# Patient Record
Sex: Female | Born: 1990 | Hispanic: Yes | Marital: Single
Health system: Midwestern US, Community
[De-identification: ages and names within clinical notes are randomized; demographics above are authoritative.]

## PROBLEM LIST (undated history)

## (undated) ENCOUNTER — Inpatient Hospital Stay (HOSPITAL_COMMUNITY): Payer: Self-pay

## (undated) DIAGNOSIS — K810 Acute cholecystitis: Principal | ICD-10-CM

## (undated) DIAGNOSIS — Z3A41 41 weeks gestation of pregnancy: Secondary | ICD-10-CM

## (undated) DIAGNOSIS — D649 Anemia, unspecified: Secondary | ICD-10-CM

## (undated) DIAGNOSIS — O09299 Supervision of pregnancy with other poor reproductive or obstetric history, unspecified trimester: Secondary | ICD-10-CM

## (undated) DIAGNOSIS — N189 Chronic kidney disease, unspecified: Secondary | ICD-10-CM

## (undated) DIAGNOSIS — O480 Post-term pregnancy: Secondary | ICD-10-CM

## (undated) DIAGNOSIS — E669 Obesity, unspecified: Secondary | ICD-10-CM

## (undated) HISTORY — PX: NEPHRECTOMY: SHX65

## (undated) HISTORY — PX: ABDOMINAL HYSTERECTOMY: SHX81

## (undated) HISTORY — DX: Anemia, unspecified: D64.9

## (undated) HISTORY — PX: NO PAST SURGERIES: SHX2092

## (undated) HISTORY — DX: Obesity, unspecified: E66.9

## (undated) HISTORY — PX: CHOLECYSTECTOMY: SHX55

---

## 2014-12-20 ENCOUNTER — Inpatient Hospital Stay
Admit: 2014-12-20 | Discharge: 2014-12-20 | Disposition: A | Discharge status: Home / Self Care | Payer: Self-pay | Attending: Emergency Medicine

## 2014-12-20 ENCOUNTER — Emergency Department: Admit: 2014-12-20 | Payer: Self-pay

## 2014-12-20 ENCOUNTER — Observation Stay

## 2014-12-20 DIAGNOSIS — K8010 Calculus of gallbladder with chronic cholecystitis without obstruction: Secondary | ICD-10-CM

## 2014-12-20 LAB — CBC WITH AUTOMATED DIFF
ABS. BASOPHILS: 0 10*3/uL (ref 0.0–0.2)
ABS. EOSINOPHILS: 0 10*3/uL (ref 0.0–0.8)
ABS. IMM. GRANS.: 0 10*3/uL (ref 0.0–0.5)
ABS. LYMPHOCYTES: 1.6 10*3/uL (ref 0.5–4.6)
ABS. MONOCYTES: 0.3 10*3/uL (ref 0.1–1.3)
ABS. NEUTROPHILS: 10.6 10*3/uL — ABNORMAL HIGH (ref 1.7–8.2)
BASOPHILS: 0 % (ref 0.0–2.0)
EOSINOPHILS: 0 % — ABNORMAL LOW (ref 0.5–7.8)
HCT: 40 % (ref 35.8–46.3)
HGB: 13.1 g/dL (ref 11.7–15.4)
IMMATURE GRANULOCYTES: 0.2 % (ref 0.0–5.0)
LYMPHOCYTES: 13 % (ref 13–44)
MCH: 25.6 PG — ABNORMAL LOW (ref 26.1–32.9)
MCHC: 32.8 g/dL (ref 31.4–35.0)
MCV: 78.1 FL — ABNORMAL LOW (ref 79.6–97.8)
MONOCYTES: 2 % — ABNORMAL LOW (ref 4.0–12.0)
MPV: 9.8 FL — ABNORMAL LOW (ref 10.8–14.1)
NEUTROPHILS: 85 % — ABNORMAL HIGH (ref 43–78)
PLATELET: 235 10*3/uL (ref 150–450)
RBC: 5.12 M/uL (ref 4.05–5.25)
RDW: 13.4 % (ref 11.9–14.6)
WBC: 12.6 10*3/uL — ABNORMAL HIGH (ref 4.3–11.1)

## 2014-12-20 LAB — METABOLIC PANEL, COMPREHENSIVE
A-G Ratio: 1 — ABNORMAL LOW (ref 1.2–3.5)
ALT (SGPT): 28 U/L (ref 12–65)
AST (SGOT): 20 U/L (ref 15–37)
Albumin: 4.2 g/dL (ref 3.5–5.0)
Alk. phosphatase: 118 U/L (ref 50–136)
Anion gap: 8 mmol/L (ref 7–16)
BUN: 12 MG/DL (ref 6–23)
Bilirubin, total: 0.4 MG/DL (ref 0.2–1.1)
CO2: 31 mmol/L (ref 21–32)
Calcium: 9.1 MG/DL (ref 8.3–10.4)
Chloride: 101 mmol/L (ref 98–107)
Creatinine: 0.84 MG/DL (ref 0.6–1.0)
GFR est AA: >60 mL/min/{1.73_m2} (ref >60)
GFR est non-AA: >60 mL/min/{1.73_m2} (ref >60)
Globulin: 4.2 g/dL — ABNORMAL HIGH (ref 2.3–3.5)
Glucose: 122 mg/dL — ABNORMAL HIGH (ref 65–100)
Potassium: 3.7 mmol/L (ref 3.5–5.1)
Protein, total: 8.4 g/dL — ABNORMAL HIGH (ref 6.3–8.2)
Sodium: 140 mmol/L (ref 136–145)

## 2014-12-20 LAB — HCG URINE, QL. - POC: Pregnancy test,urine (POC): NEGATIVE

## 2014-12-20 LAB — LIPASE: Lipase: 181 U/L (ref 73–393)

## 2014-12-20 MED ORDER — DEXAMETHASONE SODIUM PHOSPHATE 4 MG/ML IJ SOLN
4 mg/mL | INTRAMUSCULAR | Status: DC | PRN
Start: 2014-12-20 — End: 2014-12-20
  Administered 2014-12-20: 20:00:00 via INTRAVENOUS

## 2014-12-20 MED ORDER — FAMOTIDINE 20 MG TAB
20 mg | Freq: Once | ORAL | Status: AC
Start: 2014-12-20 — End: 2014-12-20
  Administered 2014-12-20: 17:00:00 via ORAL

## 2014-12-20 MED ORDER — HYDROMORPHONE (PF) 2 MG/ML IJ SOLN
2 mg/mL | INTRAMUSCULAR | Status: DC | PRN
Start: 2014-12-20 — End: 2014-12-20
  Administered 2014-12-20: 22:00:00 via INTRAVENOUS

## 2014-12-20 MED ORDER — DICYCLOMINE 10 MG CAP
10 mg | ORAL | Status: AC
Start: 2014-12-20 — End: 2014-12-20
  Administered 2014-12-20: 13:00:00 via ORAL

## 2014-12-20 MED ORDER — ROCURONIUM 10 MG/ML IV
10 mg/mL | INTRAVENOUS | Status: AC
Start: 2014-12-20 — End: ?

## 2014-12-20 MED ORDER — FENTANYL CITRATE (PF) 50 MCG/ML IJ SOLN
50 mcg/mL | Freq: Once | INTRAMUSCULAR | Status: AC
Start: 2014-12-20 — End: 2014-12-20
  Administered 2014-12-20: 17:00:00 via INTRAVENOUS

## 2014-12-20 MED ORDER — FENTANYL CITRATE (PF) 50 MCG/ML IJ SOLN
50 mcg/mL | Freq: Once | INTRAMUSCULAR | Status: AC
Start: 2014-12-20 — End: 2014-12-20
  Administered 2014-12-20: 19:00:00 via INTRAVENOUS

## 2014-12-20 MED ORDER — BUPIVACAINE-EPINEPHRINE (PF) 0.5 %-1:200,000 IJ SOLN
0.5 %-1:200,000 | INTRAMUSCULAR | Status: AC
Start: 2014-12-20 — End: ?

## 2014-12-20 MED ORDER — BUPIVACAINE-EPINEPHRINE (PF) 0.5 %-1:200,000 IJ SOLN
0.5 %-1:200,000 | INTRAMUSCULAR | Status: DC | PRN
Start: 2014-12-20 — End: 2014-12-20
  Administered 2014-12-20: 20:00:00

## 2014-12-20 MED ORDER — SODIUM CHLORIDE 0.9% BOLUS IV
0.9 % | Freq: Once | INTRAVENOUS | Status: AC
Start: 2014-12-20 — End: 2014-12-20
  Administered 2014-12-20: 13:00:00 via INTRAVENOUS

## 2014-12-20 MED ORDER — FENTANYL CITRATE (PF) 50 MCG/ML IJ SOLN
50 mcg/mL | INTRAMUSCULAR | Status: AC
Start: 2014-12-20 — End: ?

## 2014-12-20 MED ORDER — HYDROCODONE-ACETAMINOPHEN 5 MG-325 MG TAB
5-325 mg | ORAL | Status: DC | PRN
Start: 2014-12-20 — End: 2014-12-20

## 2014-12-20 MED ORDER — ACETAMINOPHEN 325 MG TABLET
325 mg | ORAL | Status: DC | PRN
Start: 2014-12-20 — End: 2014-12-21

## 2014-12-20 MED ORDER — FENTANYL CITRATE (PF) 50 MCG/ML IJ SOLN
50 mcg/mL | INTRAMUSCULAR | Status: DC | PRN
Start: 2014-12-20 — End: 2014-12-20
  Administered 2014-12-20 (×3): via INTRAVENOUS

## 2014-12-20 MED ORDER — HYDROMORPHONE (PF) 1 MG/ML IJ SOLN
1 mg/mL | INTRAMUSCULAR | Status: DC | PRN
Start: 2014-12-20 — End: 2014-12-21

## 2014-12-20 MED ORDER — LACTATED RINGERS IV
INTRAVENOUS | Status: DC
Start: 2014-12-20 — End: 2014-12-20
  Administered 2014-12-20 (×3): via INTRAVENOUS

## 2014-12-20 MED ORDER — ROCURONIUM 10 MG/ML IV
10 mg/mL | INTRAVENOUS | Status: DC | PRN
Start: 2014-12-20 — End: 2014-12-20
  Administered 2014-12-20 (×2): via INTRAVENOUS

## 2014-12-20 MED ORDER — LIDOCAINE HCL 1 % (10 MG/ML) IJ SOLN
10 mg/mL (1 %) | INTRAMUSCULAR | Status: DC | PRN
Start: 2014-12-20 — End: 2014-12-20

## 2014-12-20 MED ORDER — MIDAZOLAM 1 MG/ML IJ SOLN
1 mg/mL | Freq: Once | INTRAMUSCULAR | Status: DC
Start: 2014-12-20 — End: 2014-12-20

## 2014-12-20 MED ORDER — DEXTROSE 5%-1/2 NORMAL SALINE IV
INTRAVENOUS | Status: DC
Start: 2014-12-20 — End: 2014-12-21
  Administered 2014-12-20: via INTRAVENOUS

## 2014-12-20 MED ORDER — LIDOCAINE (PF) 20 MG/ML (2 %) IV SYRINGE
100 mg/5 mL (2 %) | INTRAVENOUS | Status: AC
Start: 2014-12-20 — End: ?

## 2014-12-20 MED ORDER — SODIUM CHLORIDE 0.9 % IJ SYRG
INTRAMUSCULAR | Status: DC | PRN
Start: 2014-12-20 — End: 2014-12-21

## 2014-12-20 MED ORDER — HYDROMORPHONE (PF) 1 MG/ML IJ SOLN
1 mg/mL | INTRAMUSCULAR | Status: AC
Start: 2014-12-20 — End: 2014-12-20
  Administered 2014-12-20: 14:00:00 via INTRAVENOUS

## 2014-12-20 MED ORDER — FAMOTIDINE (PF) 20 MG/2 ML IV
20 mg/2 mL | INTRAVENOUS | Status: AC
Start: 2014-12-20 — End: 2014-12-20
  Administered 2014-12-20: 14:00:00 via INTRAVENOUS

## 2014-12-20 MED ORDER — GLYCOPYRROLATE 0.2 MG/ML IJ SOLN
0.2 mg/mL | INTRAMUSCULAR | Status: AC
Start: 2014-12-20 — End: ?

## 2014-12-20 MED ORDER — MEPERIDINE (PF) 25 MG/ML INJ SOLUTION
25 mg/ml | INTRAMUSCULAR | Status: AC
Start: 2014-12-20 — End: 2014-12-20
  Administered 2014-12-20: 13:00:00 via INTRAVENOUS

## 2014-12-20 MED ORDER — ONDANSETRON (PF) 4 MG/2 ML INJECTION
4 mg/2 mL | INTRAMUSCULAR | Status: DC | PRN
Start: 2014-12-20 — End: 2014-12-21

## 2014-12-20 MED ORDER — NEOSTIGMINE METHYLSULFATE 1 MG/ML INJECTION
1 mg/mL | INTRAMUSCULAR | Status: AC
Start: 2014-12-20 — End: ?

## 2014-12-20 MED ORDER — HYDROCODONE-ACETAMINOPHEN 10 MG-325 MG TAB
10-325 mg | ORAL | Status: DC | PRN
Start: 2014-12-20 — End: 2014-12-20

## 2014-12-20 MED ORDER — SUCCINYLCHOLINE CHLORIDE 20 MG/ML INJECTION
20 mg/mL | INTRAMUSCULAR | Status: DC | PRN
Start: 2014-12-20 — End: 2014-12-20
  Administered 2014-12-20: 20:00:00 via INTRAVENOUS

## 2014-12-20 MED ORDER — LIDOCAINE (PF) 20 MG/ML (2 %) IJ SOLN
20 mg/mL (2 %) | INTRAMUSCULAR | Status: DC | PRN
Start: 2014-12-20 — End: 2014-12-20
  Administered 2014-12-20: 20:00:00 via INTRAVENOUS

## 2014-12-20 MED ORDER — DEXAMETHASONE SODIUM PHOSPHATE 10 MG/ML IJ SOLN
10 mg/mL | INTRAMUSCULAR | Status: AC
Start: 2014-12-20 — End: ?

## 2014-12-20 MED ORDER — KETOROLAC TROMETHAMINE 30 MG/ML INJECTION
30 mg/mL (1 mL) | INTRAMUSCULAR | Status: DC | PRN
Start: 2014-12-20 — End: 2014-12-20
  Administered 2014-12-20: 21:00:00 via INTRAVENOUS

## 2014-12-20 MED ORDER — PROPOFOL 10 MG/ML IV EMUL
10 mg/mL | INTRAVENOUS | Status: AC
Start: 2014-12-20 — End: ?

## 2014-12-20 MED ORDER — CEFAZOLIN 2 GRAM/50 ML NS IVPB
Freq: Once | INTRAVENOUS | Status: AC
Start: 2014-12-20 — End: 2014-12-20
  Administered 2014-12-20 (×2): via INTRAVENOUS

## 2014-12-20 MED ORDER — GLYCOPYRROLATE 0.2 MG/ML IJ SOLN
0.2 mg/mL | INTRAMUSCULAR | Status: DC | PRN
Start: 2014-12-20 — End: 2014-12-20
  Administered 2014-12-20: 21:00:00 via INTRAVENOUS

## 2014-12-20 MED ORDER — LACTATED RINGERS BOLUS IV
Freq: Once | INTRAVENOUS | Status: DC
Start: 2014-12-20 — End: 2014-12-20

## 2014-12-20 MED ORDER — LACTATED RINGERS IV
INTRAVENOUS | Status: DC
Start: 2014-12-20 — End: 2014-12-20

## 2014-12-20 MED ORDER — SODIUM CHLORIDE 0.9 % IJ SYRG
Freq: Three times a day (TID) | INTRAMUSCULAR | Status: DC
Start: 2014-12-20 — End: 2014-12-21
  Administered 2014-12-21 (×2): via INTRAVENOUS

## 2014-12-20 MED ORDER — SUCCINYLCHOLINE CHLORIDE 20 MG/ML INJECTION
20 mg/mL | INTRAMUSCULAR | Status: AC
Start: 2014-12-20 — End: ?

## 2014-12-20 MED ORDER — NEOSTIGMINE METHYLSULFATE 1 MG/ML INJECTION
1 mg/mL | INTRAMUSCULAR | Status: DC | PRN
Start: 2014-12-20 — End: 2014-12-20
  Administered 2014-12-20: 21:00:00 via INTRAVENOUS

## 2014-12-20 MED ORDER — OXYCODONE-ACETAMINOPHEN 10 MG-325 MG TAB
10-325 mg | ORAL | Status: DC | PRN
Start: 2014-12-20 — End: 2014-12-21
  Administered 2014-12-21: 03:00:00 via ORAL

## 2014-12-20 MED ORDER — ONDANSETRON (PF) 4 MG/2 ML INJECTION
4 mg/2 mL | INTRAMUSCULAR | Status: DC | PRN
Start: 2014-12-20 — End: 2014-12-20
  Administered 2014-12-20: 20:00:00 via INTRAVENOUS

## 2014-12-20 MED ORDER — ONDANSETRON (PF) 4 MG/2 ML INJECTION
4 mg/2 mL | INTRAMUSCULAR | Status: AC
Start: 2014-12-20 — End: 2014-12-20
  Administered 2014-12-20: 13:00:00 via INTRAVENOUS

## 2014-12-20 MED ORDER — PROPOFOL 10 MG/ML IV EMUL
10 mg/mL | INTRAVENOUS | Status: DC | PRN
Start: 2014-12-20 — End: 2014-12-20
  Administered 2014-12-20: 20:00:00 via INTRAVENOUS

## 2014-12-20 MED ORDER — KETOROLAC TROMETHAMINE 30 MG/ML INJECTION
30 mg/mL (1 mL) | INTRAMUSCULAR | Status: AC
Start: 2014-12-20 — End: ?

## 2014-12-20 MED ORDER — ONDANSETRON (PF) 4 MG/2 ML INJECTION
4 mg/2 mL | INTRAMUSCULAR | Status: AC
Start: 2014-12-20 — End: ?

## 2014-12-20 MED FILL — FENTANYL CITRATE (PF) 50 MCG/ML IJ SOLN: 50 mcg/mL | INTRAMUSCULAR | Qty: 2

## 2014-12-20 MED FILL — HYDROMORPHONE (PF) 2 MG/ML IJ SOLN: 2 mg/mL | INTRAMUSCULAR | Qty: 1

## 2014-12-20 MED FILL — FAMOTIDINE 20 MG TAB: 20 mg | ORAL | Qty: 1

## 2014-12-20 MED FILL — MEPERIDINE (PF) 25MG/ML INJECTION: 25 mg/mL | INTRAMUSCULAR | Qty: 1

## 2014-12-20 MED FILL — QUELICIN 20 MG/ML INJECTION SOLUTION: 20 mg/mL | INTRAMUSCULAR | Qty: 10

## 2014-12-20 MED FILL — PROPOFOL 10 MG/ML IV EMUL: 10 mg/mL | INTRAVENOUS | Qty: 20

## 2014-12-20 MED FILL — ONDANSETRON (PF) 4 MG/2 ML INJECTION: 4 mg/2 mL | INTRAMUSCULAR | Qty: 2

## 2014-12-20 MED FILL — BUPIVACAINE-EPINEPHRINE (PF) 0.5 %-1:200,000 IJ SOLN: 0.5 %-1:200,000 | INTRAMUSCULAR | Qty: 30

## 2014-12-20 MED FILL — DEXAMETHASONE SODIUM PHOSPHATE 10 MG/ML IJ SOLN: 10 mg/mL | INTRAMUSCULAR | Qty: 1

## 2014-12-20 MED FILL — KETOROLAC TROMETHAMINE 30 MG/ML INJECTION: 30 mg/mL (1 mL) | INTRAMUSCULAR | Qty: 1

## 2014-12-20 MED FILL — FAMOTIDINE (PF) 20 MG/2 ML IV: 20 mg/2 mL | INTRAVENOUS | Qty: 2

## 2014-12-20 MED FILL — HYDROMORPHONE (PF) 1 MG/ML IJ SOLN: 1 mg/mL | INTRAMUSCULAR | Qty: 1

## 2014-12-20 MED FILL — MIDAZOLAM 1 MG/ML IJ SOLN: 1 mg/mL | INTRAMUSCULAR | Qty: 2

## 2014-12-20 MED FILL — ROCURONIUM 10 MG/ML IV: 10 mg/mL | INTRAVENOUS | Qty: 5

## 2014-12-20 MED FILL — DICYCLOMINE 10 MG CAP: 10 mg | ORAL | Qty: 2

## 2014-12-20 MED FILL — NEOSTIGMINE METHYLSULFATE 1 MG/ML INJECTION: 1 mg/mL | INTRAMUSCULAR | Qty: 10

## 2014-12-20 MED FILL — LIDOCAINE (PF) 20 MG/ML (2 %) IV SYRINGE: 100 mg/5 mL (2 %) | INTRAVENOUS | Qty: 5

## 2014-12-20 MED FILL — DEXTROSE 5%-1/2 NORMAL SALINE IV: INTRAVENOUS | Qty: 1000

## 2014-12-20 MED FILL — GLYCOPYRROLATE 0.2 MG/ML IJ SOLN: 0.2 mg/mL | INTRAMUSCULAR | Qty: 2

## 2014-12-20 MED FILL — CEFAZOLIN 2 GRAM/50 ML NS IVPB: INTRAVENOUS | Qty: 50

## 2014-12-20 NOTE — Op Note (Signed)
Dictated   NWG#[id number]#[id number]

## 2014-12-20 NOTE — Op Note (Signed)
[facility]                           [address]                            [place] [id number]                                [phone]                                OPERATIVE REPORT  ________________________________________________________________________  NAME:  Katrinka BlazingMartinez Zelaya,                           MR:  [id number]  [place]  LOC:  3MS 360 01            SEX:  F               ACCT:  [id number]  DOB:  16-Sep-1990            AGE:  24              PT:  V  ADMIT:  12/20/2014          DSCH:  12/21/2014     MSV:  ________________________________________________________________________        DATE: 12/20/2014    PREOPERATIVE DIAGNOSES  1. Acute cholecystitis.  2. Cholelithiasis.    POSTOPERATIVE DIAGNOSES  1. Acute cholecystitis.  2. Cholelithiasis.    NAME OF PROCEDURE: Laparoscopic cholecystectomy.    SURGEON: Jesse Sanshomas C. Mikal PlaneMann Jr, MD.    ANESTHESIA: General.    COMPLICATIONS: None.    COUNTS: Correct.    ESTIMATED BLOOD LOSS: Minimal.    SPECIMEN: Gallbladder.    FINDINGS: Acutely inflamed gallbladder with a thick wall. During  dissection, the gallbladder was entered with spillage of some small  amount of bile. At the end of the case, extensive irrigation was utilized  and all fluid suctioned free. The gallbladder was removed in a bag.    PROCEDURE: After the patient was asleep, the abdomen was prepped and  draped in sterile fashion. Incision was made in the umbilicus, dissection  carried down and fascia opened, direct entry into the abdominal cavity  gained. The fascial edges were controlled with 0 Vicryl. Hasson trocar  placed, followed by pneumoperitoneum. The 5 mm scope was introduced.  Under direct visualization, and after infiltration of the skin,  subcutaneous tissue and peritoneum with 0.5% Marcaine with epinephrine, 2  right subcostal 5 mm trocars were placed, as well as superior midline  epigastric trocar. The gallbladder was grasped at its dome and  tented  upwards. Dissection was then done, dissecting out the cystic artery. This  was doubly hemoclipped and divided.  The cystic duct was then  circumferentially dissected free. I also dissected up on the gallbladder  bed approximately one-third of the way up. I then placed a single  hemoclip at the cystic duct exit from the gallbladder. Three were placed  distally and the cystic duct divided. The gallbladder was then removed  from the gallbladder bed with the Harmonic scalpel and extracted through  the umbilical port.    The gallbladder bed was inspected and was hemostatic. Hemoclips were seen  on  the cystic duct, intact and without leakage. All trocars were removed  and pneumoperitoneum released. 0 Vicryl was used to the close the fascia  and umbilical port. Skin was closed with 4-0 Monocryl and Dermabond.  Sterile dressing was applied.                Braulio Bosch, MD                This is an unverified document unless signed by physician.    TID:  [id number]                                      DT:  12/20/2014 07:55 P  JOB:  [id number]           DOC#:  [id number]           DD:  12/20/2014    cc:   Braulio Bosch, MD

## 2014-12-20 NOTE — Progress Notes (Signed)
TRANSFER - IN REPORT:    Verbal report received from Heceta Beachindy, RN (name) on Rachael Harrison  being received from PACU (unit) for routine progression of care      Report consisted of patient???s Situation, Background, Assessment and   Recommendations(SBAR).     Information from the following report(s) SBAR, Kardex, Intake/Output and Recent Results was reviewed with the receiving nurse.    Opportunity for questions and clarification was provided.      Assessment completed upon patient???s arrival to unit and care assumed.

## 2014-12-20 NOTE — Progress Notes (Signed)
Pt arrived from PACU. Assessment completed. Pt Spanish speaking. Interpreter in room. Pt alert and oriented x4. Respirations even and unlabored. Lung sounds clear on room air. Vitals WDL. Abdomen soft, tender, bowel sounds hypoactive. 4 abdl puncture sites CDI with dermabond. Pt denies abdl pain. Pt has already voided in PACU. Instructed to save urine for UOP measurement. SCDs placed on pt. IVF started. Pt oriented to room and call light. Instructed to call for assistance OOB or if other needs arise. All safety measures in place. Family at bedside.

## 2014-12-20 NOTE — ED Notes (Signed)
Pt c/o abdominal pain lasting last 4 hours. All answers given via family member interpreting while waiting on staff interpretor.

## 2014-12-20 NOTE — Other (Signed)
TRANSFER - IN REPORT:    Verbal report received from [facility] [name]  being received from ER(unit) for routine progression of care      Report consisted of patient???s Situation, Background, Assessment and   Recommendations(SBAR).     Information from the following report(s) SBAR and MAR was reviewed with the receiving nurse.    Opportunity for questions and clarification was provided.      Assessment completed upon patient???s arrival to unit and care assumed.

## 2014-12-20 NOTE — Progress Notes (Signed)
Interpreter present when Dr. Loreta AveMann came to speak with the patient.    Thank you,      Brion AlimentBelinda Clark  CERTIFIED HEALTHCARE INTERPRETER ???  [address] [facility]  [phone]  [email or web address]

## 2014-12-20 NOTE — Op Note (Signed)
[facility]                           [address]                            [place], [place] [id number]                                [phone]                                OPERATIVE REPORT  ________________________________________________________________________  NAME:  Rachael Harrison,                           MR:  [id number]  [name]  LOC:  3MS 360 01            SEX:  F               ACCT:  [id number]  DOB:  12-25-1990            AGE:  24              PT:  V  ADMIT:  12/20/2014          DSCH:  12/21/2014     MSV:  ________________________________________________________________________        DATE: 12/20/2014    PREOPERATIVE DIAGNOSES  1. Acute cholecystitis.  2. Cholelithiasis.    POSTOPERATIVE DIAGNOSES  1. Acute cholecystitis.  2. Cholelithiasis.    NAME OF PROCEDURE: Laparoscopic cholecystectomy.    SURGEON: Jesse Sanshomas C. Mikal PlaneMann Jr, MD.    ANESTHESIA: General.    COMPLICATIONS: None.    COUNTS: Correct.    ESTIMATED BLOOD LOSS: Minimal.    SPECIMEN: Gallbladder.    FINDINGS: Acutely inflamed gallbladder with a thick wall. During  dissection, the gallbladder was entered with spillage of some small  amount of bile. At the end of the case, extensive irrigation was utilized  and all fluid suctioned free. The gallbladder was removed in a bag.    PROCEDURE: After the patient was asleep, the abdomen was prepped and  draped in sterile fashion. Incision was made in the umbilicus, dissection  carried down and fascia opened, direct entry into the abdominal cavity  gained. The fascial edges were controlled with 0 Vicryl. Hasson trocar  placed, followed by pneumoperitoneum. The 5 mm scope was introduced.  Under direct visualization, and after infiltration of the skin,  subcutaneous tissue and peritoneum with 0.5% Marcaine with epinephrine, 2  right subcostal 5 mm trocars were placed, as well as superior midline   epigastric trocar. The gallbladder was grasped at its dome and tented  upwards. Dissection was then done, dissecting out the cystic artery. This  was doubly hemoclipped and divided.  The cystic duct was then  circumferentially dissected free. I also dissected up on the gallbladder  bed approximately one-third of the way up. I then placed a single  hemoclip at the cystic duct exit from the gallbladder. Three were placed  distally and the cystic duct divided. The gallbladder was then removed  from the gallbladder bed with the Harmonic scalpel and extracted through  the umbilical port.    The gallbladder bed was inspected and was hemostatic. Hemoclips were seen  on  the cystic duct, intact and without leakage. All trocars were removed  and pneumoperitoneum released. 0 Vicryl was used to the close the fascia  and umbilical port. Skin was closed with 4-0 Monocryl and Dermabond.  Sterile dressing was applied.                Braulio Bosch, MD                This is an unverified document unless signed by physician.    TID:  [id number]                                      DT:  12/20/2014 07:55 P  JOB:  [id number]           DOC#:  [id number]           DD:  12/20/2014    cc:   Braulio Bosch, MD

## 2014-12-20 NOTE — ED Notes (Signed)
TRANSFER - OUT REPORT:    Verbal report given to MadisonShelly, RN on Rachael Harrison  being transferred to Pre-Op Holding for routine progression of care       Report consisted of patient???s Situation, Background, Assessment and   Recommendations(SBAR).     Information from the following report(s) ED Summary was reviewed with the receiving nurse.    Lines:   Peripheral IV 12/20/14 Left Antecubital (Active)   Site Assessment Clean, dry, & intact 12/20/2014  9:03 AM   Phlebitis Assessment 0 12/20/2014  9:03 AM   Infiltration Assessment 0 12/20/2014  9:03 AM   Dressing Status Clean, dry, & intact 12/20/2014  9:03 AM   Dressing Type Tape;Transparent 12/20/2014  9:03 AM   Hub Color/Line Status Pink 12/20/2014  9:03 AM   Action Taken Blood drawn 12/20/2014  9:03 AM        Opportunity for questions and clarification was provided.

## 2014-12-20 NOTE — Other (Signed)
Pt with c/o pain in right lower abdomen. Interpreter present. Will call anesthesiologist for orders.

## 2014-12-20 NOTE — Other (Signed)
TRANSFER - OUT REPORT:    Verbal report given to Rachael Harrison on [name]  being transferred to med surg room 360 for routine progression of care       Report consisted of patient???s Situation, Background, Assessment and   Recommendations(SBAR).     Information from the following report(s) OR Summary, Intake/Output and MAR was reviewed with the receiving nurse.    Opportunity for questions and clarification was provided.      Patient transported with:   Registered Nurse

## 2014-12-20 NOTE — Anesthesia Pre-Procedure Evaluation (Signed)
Anesthetic History               Review of Systems / Medical History  Patient summary reviewed and pertinent labs reviewed    Pulmonary                   Neuro/Psych              Cardiovascular                  Exercise tolerance: >4 METS     GI/Hepatic/Renal                Endo/Other             Other Findings              Physical Exam    Airway  Mallampati: II  TM Distance: 4 - 6 cm  Neck ROM: normal range of motion   Mouth opening: Normal     Cardiovascular  Regular rate and rhythm,  S1 and S2 normal,  no murmur, click, rub, or gallop             Dental         Pulmonary  Breath sounds clear to auscultation               Abdominal         Other Findings            Anesthetic Plan    ASA: 1, emergent  Anesthesia type: general          Induction: Intravenous  Anesthetic plan and risks discussed with: Patient and Parent / Guardian

## 2014-12-20 NOTE — Progress Notes (Signed)
Interpreter present for triage with Glenn-RN    Thank you,      Brion AlimentBelinda Clark  CERTIFIED HEALTHCARE INTERPRETER ???  [address] [facility]  [phone]  [email or web address]

## 2014-12-20 NOTE — Progress Notes (Signed)
Percocet 10mg  given for abdl pain. Pt has been up in room ambulating to bathroom and voiding.

## 2014-12-20 NOTE — Brief Op Note (Signed)
BRIEF OPERATIVE NOTE    Date of Procedure: 12/20/2014   Preoperative Diagnosis: Cholecystitis,Cholelthiasis  Postoperative Diagnosis: Cholecystitis,Cholelithiasis    Procedure(s):  CHOLECYSTECTOMY LAPAROSCOPIC  Surgeon(s) and Role:     Braulio Bosch* Maddix Kliewer C Mann Jr., MD - Primary  Anesthesia: General   Estimated Blood Loss: minimal  Specimens:   ID Type Source Tests Collected by Time Destination   1 : GALLBLADDER Preservative Gallbladder  Braulio Boschhomas C Mann Jr., MD 12/20/2014 1658 Pathology      Findings: see op note   Complications: none  Implants: * No implants in log *

## 2014-12-20 NOTE — ED Provider Notes (Signed)
Patient is a 24 y.o. female presenting with abdominal pain. The history is provided by the patient.   Abdominal Pain   This is a new problem. Episode onset: 4 AM. The problem occurs constantly. The problem has not changed since onset.The pain is associated with vomiting (patient threw up once at 6 AM). The pain is located in the RUQ. The pain is moderate. Associated symptoms include nausea and vomiting. Pertinent negatives include no fever, no diarrhea, no dysuria, no headaches, no arthralgias, no chest pain and no back pain. Nothing worsens the pain. The pain is relieved by nothing. Past workup includes ultrasound. Past workup comments: Is told that she had gallstones, based on an ultrasound done one [place] ago [place]. Her past medical history does not include PUD or DM. The patient's surgical history non-contributory.       History reviewed. No pertinent past medical history.    History reviewed. No pertinent past surgical history.      History reviewed. No pertinent family history.    History     Social History   ??? Marital Status: SINGLE     Spouse Name: N/A   ??? Number of Children: N/A   ??? Years of Education: N/A     Occupational History   ??? Not on file.     Social History Main Topics   ??? Smoking status: Never Smoker    ??? Smokeless tobacco: Not on file   ??? Alcohol Use: Yes      Comment: occasional   ??? Drug Use: No   ??? Sexual Activity: Not on file     Other Topics Concern   ??? Not on file     Social History Narrative   ??? No narrative on file           ALLERGIES: Review of patient's allergies indicates no known allergies.      Review of Systems   Constitutional: Negative for fever and chills.   HENT: Negative for rhinorrhea and sore throat.    Eyes: Negative for discharge and redness.   Respiratory: Negative for cough and shortness of breath.    Cardiovascular: Negative for chest pain and palpitations.   Gastrointestinal: Positive for nausea, vomiting and abdominal pain. Negative for diarrhea.    Genitourinary: Negative for dysuria and difficulty urinating.   Musculoskeletal: Negative for back pain and arthralgias.   Skin: Negative for rash.   Neurological: Negative for dizziness and headaches.   All other systems reviewed and are negative.      Filed Vitals:    12/20/14 0841   BP: 138/81   Pulse: 68   Temp: 98.3 ??F (36.8 ??C)   Resp: 20   Height: 5\' 2"  (1.575 m)   Weight: 77.111 kg (170 lb)   SpO2: 100%            Physical Exam   Constitutional: She is oriented to person, place, and time. She appears well-developed and well-nourished. No distress.   Uncomfortable but not distress   HENT:   Head: Normocephalic and atraumatic.   Eyes: Conjunctivae are normal. Pupils are equal, round, and reactive to light. Right eye exhibits no discharge. Left eye exhibits no discharge. No scleral icterus.   Neck: Normal range of motion. Neck supple.   Cardiovascular: Normal rate, regular rhythm and normal heart sounds.  Exam reveals no gallop.    No murmur heard.  Pulmonary/Chest: Effort normal and breath sounds normal. No respiratory distress. She has no wheezes. She has no  rales.   Abdominal: Soft. Bowel sounds are normal. There is tenderness in the right upper quadrant and right lower quadrant. There is no guarding.   Musculoskeletal: Normal range of motion. She exhibits no edema.   Neurological: She is alert and oriented to person, place, and time. She exhibits normal muscle tone.   cni 2-12 grossly   Skin: Skin is warm and dry. She is not diaphoretic.   Psychiatric: She has a normal mood and affect. Her behavior is normal.   Nursing note and vitals reviewed.       MDM    Procedures

## 2014-12-20 NOTE — ED Notes (Signed)
Report given to Bob L. RN

## 2014-12-20 NOTE — Progress Notes (Signed)
Interpreter present for consents with Cindy-RN as well as for Dr. Wilkie AyeHorton (Anesthesiologist)    Thank you,      Brion AlimentBelinda Clark  CERTIFIED HEALTHCARE INTERPRETER ???  [address] [facility]  [phone]  [email or web address]

## 2014-12-20 NOTE — Op Note (Signed)
Dictated   Job#[id number]

## 2014-12-20 NOTE — H&P (Signed)
Gallbladder History and Physical    Subjective:     Patient is a 24 y.o. Hispanic female who presents with abdominal pain. Onset of symptoms was abrupt with unchanged course since that time. The pain is located in the RUQ with radiation to back. Patient describes the pain as sharp, continuous, and rated as severe. Additional biliary/liver symptoms include nausea, vomiting. Pancreatic symptoms include pain radiating to the back.  Previous studies include ultrasound showing cholelithiasis and a thickened GB wall.    There are no active problems to display for this patient.    History reviewed. No pertinent past medical history.   Past Surgical History   Procedure Laterality Date   ??? Hx nephrectomy Right    ??? Hx cesarean section       History   Substance Use Topics   ??? Smoking status: Never Smoker    ??? Smokeless tobacco: Not on file   ??? Alcohol Use: Yes      Comment: occasional      History reviewed. No pertinent family history.    Prior to Admission medications    Not on File     No Known Allergies     Review of Systems   Gastrointestinal: Positive for nausea, vomiting and abdominal pain.   All other systems reviewed and are negative.      Objective:     Patient Vitals for the past 8 hrs:   BP Temp Pulse Resp SpO2 Height Weight   12/20/14 1113 131/76 mmHg 98.3 ??F (36.8 ??C) 62 16 100 % - -   12/20/14 0948 - - - - 100 % - -   12/20/14 0947 129/69 mmHg - - - - - -   12/20/14 0841 138/81 mmHg 98.3 ??F (36.8 ??C) 68 20 100 % 5' 2"  (1.575 m) 170 lb (77.111 kg)       Physical Exam   Constitutional: She is oriented to person, place, and time. She appears well-developed and well-nourished. No distress.   HENT:   Head: Normocephalic and atraumatic.   Eyes: Conjunctivae and EOM are normal. Pupils are equal, round, and reactive to light. No scleral icterus.   Neck: Normal range of motion. Neck supple.   Cardiovascular: Normal rate, regular rhythm and normal heart sounds.     Pulmonary/Chest: Effort normal and breath sounds normal. No respiratory distress. She has no wheezes.   Abdominal: Soft. Bowel sounds are normal. She exhibits no distension and no mass. There is tenderness. There is no rebound and no guarding.   Musculoskeletal: Normal range of motion.   Neurological: She is alert and oriented to person, place, and time.   Skin: Skin is warm and dry. She is not diaphoretic.   Psychiatric: She has a normal mood and affect. Her behavior is normal. Thought content normal.       Imaging and Lab Review:     Recent Results (from the past 24 hour(s))   CBC WITH AUTOMATED DIFF    Collection Time: 12/20/14  9:00 AM   Result Value Ref Range    WBC 12.6 (H) 4.3 - 11.1 K/uL    RBC 5.12 4.05 - 5.25 M/uL    HGB 13.1 11.7 - 15.4 g/dL    HCT 40.0 35.8 - 46.3 %    MCV 78.1 (L) 79.6 - 97.8 FL    MCH 25.6 (L) 26.1 - 32.9 PG    MCHC 32.8 31.4 - 35.0 g/dL    RDW 13.4 11.9 - 14.6 %  PLATELET 235 150 - 450 K/uL    MPV 9.8 (L) 10.8 - 14.1 FL    DF AUTOMATED      NEUTROPHILS 85 (H) 43 - 78 %    LYMPHOCYTES 13 13 - 44 %    MONOCYTES 2 (L) 4.0 - 12.0 %    EOSINOPHILS 0 (L) 0.5 - 7.8 %    BASOPHILS 0 0.0 - 2.0 %    IMMATURE GRANULOCYTES 0.2 0.0 - 5.0 %    ABS. NEUTROPHILS 10.6 (H) 1.7 - 8.2 K/UL    ABS. LYMPHOCYTES 1.6 0.5 - 4.6 K/UL    ABS. MONOCYTES 0.3 0.1 - 1.3 K/UL    ABS. EOSINOPHILS 0.0 0.0 - 0.8 K/UL    ABS. BASOPHILS 0.0 0.0 - 0.2 K/UL    ABS. IMM. GRANS. 0.0 0.0 - 0.5 K/UL   METABOLIC PANEL, COMPREHENSIVE    Collection Time: 12/20/14  9:00 AM   Result Value Ref Range    Sodium 140 136 - 145 mmol/L    Potassium 3.7 3.5 - 5.1 mmol/L    Chloride 101 98 - 107 mmol/L    CO2 31 21 - 32 mmol/L    Anion gap 8 7 - 16 mmol/L    Glucose 122 (H) 65 - 100 mg/dL    BUN 12 6 - 23 MG/DL    Creatinine 0.84 0.6 - 1.0 MG/DL    GFR est AA >60 >60 ml/min/1.59m    GFR est non-AA >60 >60 ml/min/1.732m   Calcium 9.1 8.3 - 10.4 MG/DL    Bilirubin, total 0.4 0.2 - 1.1 MG/DL    ALT 28 12 - 65 U/L    AST 20 15 - 37 U/L     Alk. phosphatase 118 50 - 136 U/L    Protein, total 8.4 (H) 6.3 - 8.2 g/dL    Albumin 4.2 3.5 - 5.0 g/dL    Globulin 4.2 (H) 2.3 - 3.5 g/dL    A-G Ratio 1.0 (L) 1.2 - 3.5     LIPASE    Collection Time: 12/20/14  9:00 AM   Result Value Ref Range    Lipase 181 73 - 393 U/L   HCG URINE, QL. - POC    Collection Time: 12/20/14  9:19 AM   Result Value Ref Range    Pregnancy test,urine (POC) NEGATIVE  NEG         images and reports reviewed    Assessment:     Acute cholecystitis.Cholelithiasis.  Patient has failed conservative therapy and wishes to proceed with surgical intervention.    Plan/Recommendations/Medical Decision Making:     Discussed the risk of surgery including infection, hematoma, bleeding, bile duct or bowel injury, recurrence or persistence of symptoms or bile leak, and the risks of general anesthetic. The patient understands the risk that the procedure could be an open procedure. The patient understands the risks; any and all questions were answered to the patient's satisfaction, and they freely signed the consent for operation.     Signed By: THKela MillinMD     December 20, 2014

## 2014-12-20 NOTE — Anesthesia Post-Procedure Evaluation (Signed)
Post-Anesthesia Evaluation and Assessment    Patient: [name] MRN: [id number]  SSN: [id number]    Date of Birth: [birth year]  Age: 24 y.o.  Sex: female       Cardiovascular Function/Vital Signs  Visit Vitals   Item Reading   ??? BP 138/76 mmHg   ??? Pulse 101   ??? Temp 37.1 ??C (98.8 ??F)   ??? Resp 24   ??? Ht 5\' 2"  (1.575 m)   ??? Wt 77.111 kg (170 lb)   ??? BMI 31.09 kg/m2   ??? SpO2 100%       Patient is status post general anesthesia for Procedure(s):  CHOLECYSTECTOMY LAPAROSCOPIC.    Nausea/Vomiting: None    Postoperative hydration reviewed and adequate.    Pain:  Pain Scale 1: Numeric (0 - 10) (12/20/14 1805)  Pain Intensity 1: 4 (12/20/14 1805)   Managed    Neurological Status:       At baseline    Mental Status and Level of Consciousness: Alert and oriented     Pulmonary Status:   O2 Device: Nasal cannula (12/20/14 1754)   Adequate oxygenation and airway patent    Complications related to anesthesia: None    Post-anesthesia assessment completed. No concerns    Signed By: Thomes LollingADELE SUSAN Earlyn Sylvan, MD     December 20, 2014

## 2014-12-21 MED ORDER — OXYCODONE-ACETAMINOPHEN 7.5 MG-325 MG TAB
ORAL_TABLET | ORAL | Status: DC | PRN
Start: 2014-12-21 — End: 2015-01-07

## 2014-12-21 MED FILL — OXYCODONE-ACETAMINOPHEN 10 MG-325 MG TAB: 10-325 mg | ORAL | Qty: 1

## 2014-12-21 NOTE — Progress Notes (Signed)
POD#1  AVSS   Incisions fine. Abd soft. WBC down.Fully discussed discharge instructions with husband. All questions answered.  Discharge.

## 2014-12-21 NOTE — Progress Notes (Signed)
Patient's husband requesting a discount on the hospital bill . Instructed patient to call hospital on Monday and ask about payment plan. They verbalized understanding.

## 2014-12-21 NOTE — Progress Notes (Signed)
Pt is resting quietly with her boyfriend at the bedside. Denies c/o. Pt has been up several times to void. Has not saved urine for measurement.

## 2014-12-21 NOTE — Progress Notes (Signed)
Pt resting quietly without complaints of pain, respirations are even no distress observed.. Discharge instructions given Appropriate   Lines and drains removed  Family at bedside

## 2014-12-22 MED FILL — ONDANSETRON (PF) 4 MG/2 ML INJECTION: 4 mg/2 mL | INTRAMUSCULAR | Qty: 4

## 2014-12-22 MED FILL — PROPOFOL 10 MG/ML IV EMUL: 10 mg/mL | INTRAVENOUS | Qty: 200

## 2014-12-22 MED FILL — DEXAMETHASONE SODIUM PHOSPHATE 10 MG/ML IJ SOLN: 10 mg/mL | INTRAMUSCULAR | Qty: 10

## 2014-12-22 MED FILL — ROCURONIUM 10 MG/ML IV: 10 mg/mL | INTRAVENOUS | Qty: 20

## 2014-12-22 MED FILL — QUELICIN 20 MG/ML INJECTION SOLUTION: 20 mg/mL | INTRAMUSCULAR | Qty: 160

## 2014-12-22 MED FILL — LIDOCAINE (PF) 20 MG/ML (2 %) IJ SOLN: 20 mg/mL (2 %) | INTRAMUSCULAR | Qty: 40

## 2014-12-22 MED FILL — KETOROLAC TROMETHAMINE 30 MG/ML INJECTION: 30 mg/mL (1 mL) | INTRAMUSCULAR | Qty: 1

## 2014-12-22 MED FILL — GLYCOPYRROLATE 0.2 MG/ML IJ SOLN: 0.2 mg/mL | INTRAMUSCULAR | Qty: 0.4

## 2014-12-22 MED FILL — NEOSTIGMINE METHYLSULFATE 1 MG/ML INJECTION: 1 mg/mL | INTRAMUSCULAR | Qty: 3

## 2015-01-07 ENCOUNTER — Ambulatory Visit: Admit: 2015-01-07 | Discharge: 2015-01-07 | Attending: Surgery

## 2015-01-07 DIAGNOSIS — K8000 Calculus of gallbladder with acute cholecystitis without obstruction: Secondary | ICD-10-CM

## 2015-01-07 NOTE — Progress Notes (Signed)
Rachael Harrison     After having a laparoscopic cholecystectomy.  Pathology is as follows :   DIAGNOSIS   ???GALLBLADDER???: CHOLELITHIASIS AND CHRONIC CHOLECYSTITIS.   sms/12/23/2014     Electronically signed out on 12/23/2014 10:27 by J. Tory Emeraldhomas Latham, Jr., M.D.   Postoperatively she has done fine.  There's been no fever.  There's been no chills..  She's had no jaundice.  She is eating and having normal bowel movements.  I will see her as needed.

## 2015-01-07 NOTE — Patient Instructions (Signed)
[facility]  [address], [address]  [place] [address]  [phone]    CARE INSTRUCTIONS  Your surgeon has given you verbal instructions regarding your plan of care today.    FOLLOW-UP AFTER A TEST OR STUDY  If you are having labs drawn or a radiologic study, there will be a follow-up appt needed after that, which either has already been made for you today or will need to be made by you to review the results of your study or test (unless special arrangements were made today for you by your surgeon).      We are unable to review results over the phone because those results invariably lead to questions that you and your surgeon need to make together during an appt.  Therefore, please do not call the office for test results as they should be discussed at your next appt, and if you don't have a next appt, please call and get one after your test is completed, or as you were instructed.    AFTER-HOURS CALLS  We have a surgeon-call for emergencies 24 hrs a day.  After the office is closed, which is usually at 5PM and on weekends and holidays, the on-call surgeon can be reached by dialing our office number ([phone]).    Please do not call unless you are having an urgent or emergent issue.  The on-call surgeon cannot make appt changes or call in refills for pain medications after the office is closed.

## 2015-08-24 NOTE — L&D Delivery Note (Signed)
Delivery Note   Requested by Dr. Pickens to attend this  repeat  C-section delivery at 41 and 3/[redacted] weeks GA .   Born to a 25 year old G2P1, GBS positive mother adequately treated with penicillin and with PNC.  Pregnancy complicated by anemia and history of nephrectomy.   Intrapartum course complicated by previous uterine surgery. ROM occurred on 6/1 at 1813  with clear fluid.   Infant vigorous with good spontaneous cry.  Routine NRP followed including warming, drying and stimulation.  Apgars 9 / 10.  Physical exam within normal limits.   Left in OR for skin-to-skin contact with mother, in care of CN staff.  Care transferred to Pediatrician.  Natalie Armstrong, NNP-BC 

## 2015-08-26 LAB — OB RESULTS CONSOLE GC/CHLAMYDIA
CHLAMYDIA, DNA PROBE: NEGATIVE
Gonorrhea: NEGATIVE

## 2015-08-26 LAB — OB RESULTS CONSOLE RPR: RPR: NONREACTIVE

## 2015-08-26 LAB — OB RESULTS CONSOLE HEPATITIS B SURFACE ANTIGEN: HEP B S AG: NEGATIVE

## 2015-08-26 LAB — OB RESULTS CONSOLE RUBELLA ANTIBODY, IGM: RUBELLA: IMMUNE

## 2015-08-26 LAB — OB RESULTS CONSOLE ANTIBODY SCREEN: ANTIBODY SCREEN: NEGATIVE

## 2015-08-26 LAB — OB RESULTS CONSOLE ABO/RH: RH TYPE: POSITIVE

## 2015-08-26 LAB — OB RESULTS CONSOLE HIV ANTIBODY (ROUTINE TESTING): HIV: NONREACTIVE

## 2015-11-12 ENCOUNTER — Encounter (HOSPITAL_COMMUNITY): Payer: Self-pay | Admitting: *Deleted

## 2015-11-12 ENCOUNTER — Other Ambulatory Visit: Payer: Self-pay | Admitting: Advanced Practice Midwife

## 2015-11-12 ENCOUNTER — Inpatient Hospital Stay (HOSPITAL_COMMUNITY)
Admission: AD | Admit: 2015-11-12 | Discharge: 2015-11-12 | Disposition: A | Discharge status: Home / Self Care | Payer: Self-pay | Source: Ambulatory Visit | Attending: Obstetrics and Gynecology | Admitting: Obstetrics and Gynecology

## 2015-11-12 DIAGNOSIS — O99513 Diseases of the respiratory system complicating pregnancy, third trimester: Secondary | ICD-10-CM | POA: Insufficient documentation

## 2015-11-12 DIAGNOSIS — R000 Tachycardia, unspecified: Secondary | ICD-10-CM | POA: Insufficient documentation

## 2015-11-12 DIAGNOSIS — J069 Acute upper respiratory infection, unspecified: Secondary | ICD-10-CM | POA: Insufficient documentation

## 2015-11-12 DIAGNOSIS — N189 Chronic kidney disease, unspecified: Secondary | ICD-10-CM | POA: Insufficient documentation

## 2015-11-12 DIAGNOSIS — Z3A31 31 weeks gestation of pregnancy: Secondary | ICD-10-CM | POA: Insufficient documentation

## 2015-11-12 DIAGNOSIS — O9989 Other specified diseases and conditions complicating pregnancy, childbirth and the puerperium: Secondary | ICD-10-CM

## 2015-11-12 DIAGNOSIS — O26833 Pregnancy related renal disease, third trimester: Secondary | ICD-10-CM | POA: Insufficient documentation

## 2015-11-12 HISTORY — DX: Chronic kidney disease, unspecified: N18.9

## 2015-11-12 LAB — URINALYSIS, ROUTINE W REFLEX MICROSCOPIC
Bilirubin Urine: NEGATIVE
GLUCOSE, UA: NEGATIVE mg/dL
KETONES UR: 40 mg/dL — AB
LEUKOCYTES UA: NEGATIVE
Nitrite: NEGATIVE
PROTEIN: 30 mg/dL — AB
Specific Gravity, Urine: 1.02 (ref 1.005–1.030)
pH: 6.5 (ref 5.0–8.0)

## 2015-11-12 LAB — URINE MICROSCOPIC-ADD ON

## 2015-11-12 LAB — INFLUENZA PANEL BY PCR (TYPE A & B)
H1N1FLUPCR: NOT DETECTED
INFLBPCR: NEGATIVE
Influenza A By PCR: NEGATIVE

## 2015-11-12 LAB — COMPREHENSIVE METABOLIC PANEL
ALBUMIN: 2.5 g/dL — AB (ref 3.5–5.0)
ALK PHOS: 103 U/L (ref 38–126)
ALT: 13 U/L — AB (ref 14–54)
AST: 17 U/L (ref 15–41)
Anion gap: 10 (ref 5–15)
BUN: <5 mg/dL — ABNORMAL LOW (ref 6–20)
CALCIUM: 8.2 mg/dL — AB (ref 8.9–10.3)
CO2: 20 mmol/L — AB (ref 22–32)
CREATININE: 0.47 mg/dL (ref 0.44–1.00)
Chloride: 106 mmol/L (ref 101–111)
GFR calc Af Amer: >60 mL/min (ref >60)
GFR calc non Af Amer: >60 mL/min (ref >60)
GLUCOSE: 98 mg/dL (ref 65–99)
Potassium: 3.5 mmol/L (ref 3.5–5.1)
SODIUM: 136 mmol/L (ref 135–145)
Total Bilirubin: 0.4 mg/dL (ref 0.3–1.2)
Total Protein: 6.1 g/dL — ABNORMAL LOW (ref 6.5–8.1)

## 2015-11-12 LAB — CBC
HCT: 32.6 % — ABNORMAL LOW (ref 36.0–46.0)
HEMOGLOBIN: 10.8 g/dL — AB (ref 12.0–15.0)
MCH: 26 pg (ref 26.0–34.0)
MCHC: 33.1 g/dL (ref 30.0–36.0)
MCV: 78.4 fL (ref 78.0–100.0)
Platelets: 187 10*3/uL (ref 150–400)
RBC: 4.16 MIL/uL (ref 3.87–5.11)
RDW: 14.9 % (ref 11.5–15.5)
WBC: 18.5 10*3/uL — ABNORMAL HIGH (ref 4.0–10.5)

## 2015-11-12 LAB — RAPID STREP SCREEN (MED CTR MEBANE ONLY): Streptococcus, Group A Screen (Direct): POSITIVE — AB

## 2015-11-12 MED ORDER — LACTATED RINGERS IV BOLUS (SEPSIS)
1000.0000 mL | Freq: Once | INTRAVENOUS | Status: AC
  Administered 2015-11-12: 1000 mL via INTRAVENOUS

## 2015-11-12 MED ORDER — FAMOTIDINE 20 MG PO TABS
20.0000 mg | ORAL_TABLET | Freq: Once | ORAL | Status: AC
  Administered 2015-11-12: 20 mg via ORAL
  Filled 2015-11-12: qty 1

## 2015-11-12 MED ORDER — RANITIDINE HCL 150 MG PO TABS: 150.0000 mg | ORAL_TABLET | Freq: Two times a day (BID) | ORAL | Status: DC

## 2015-11-12 MED ORDER — ACETAMINOPHEN 325 MG PO TABS
650.0000 mg | ORAL_TABLET | Freq: Once | ORAL | Status: AC
  Administered 2015-11-12: 650 mg via ORAL
  Filled 2015-11-12: qty 2

## 2015-11-12 MED ORDER — PENICILLIN V POTASSIUM 500 MG PO TABS: 500.0000 mg | ORAL_TABLET | Freq: Four times a day (QID) | ORAL | Status: DC

## 2015-11-12 MED ORDER — DEXTROSE 5 % IN LACTATED RINGERS IV BOLUS
1000.0000 mL | Freq: Once | INTRAVENOUS | Status: AC
  Administered 2015-11-12: 1000 mL via INTRAVENOUS

## 2015-11-12 MED ORDER — GI COCKTAIL ~~LOC~~
30.0000 mL | Freq: Once | ORAL | Status: AC
  Administered 2015-11-12: 30 mL via ORAL
  Filled 2015-11-12: qty 30

## 2015-11-12 NOTE — MAU Provider Note (Signed)
Chief Complaint:  Emesis   First Provider Initiated Contact with Patient 11/12/15 1339      HPI: Natalie Armstrong is a 25 y.o. G2P1001 at 27w1dwho presents to maternity admissions reporting onset last night of sore throat, chills and nausea with emesis x 2.  She has not taken her temperature prior to arrival in MAU.  She denies sick contacts but is concerned she may have the flu.  She denies runny or stuffy nose or other respiratory symptoms other than sore throat. She denies cough.  She does report recent heartburn x 1 week, described as burning in her upper chest and throat that occurs after eating or when her stomach is empty. She reports good fetal movement, denies cramping/contractions, LOF, vaginal bleeding, vaginal itching/burning, urinary symptoms, h/a, dizziness, n/v, or fever/chills.    HPI  Past Medical History: Past Medical History  Diagnosis Date  . Chronic kidney disease     Past obstetric history: OB History  Gravida Para Term Preterm AB SAB TAB Ectopic Multiple Living  # Outcome Date GA Lbr Len/2nd Weight Sex Delivery Anes PTL Lv  2 Current           1 Term               Past Surgical History: Past Surgical History  Procedure Laterality Date  . Cesarean section    . Nephrectomy    . Cholecystectomy      Family History: History reviewed. No pertinent family history.  Social History: Social History  Substance Use Topics  . Smoking status: Never Smoker   . Smokeless tobacco: None  . Alcohol Use: No    Allergies: No Known Allergies  Meds:  No prescriptions prior to admission    ROS:  Review of Systems  Constitutional: Positive for chills. Negative for fever and fatigue.  Respiratory: Negative for cough and shortness of breath.   Cardiovascular: Negative for chest pain.  Gastrointestinal: Positive for nausea and vomiting.  Genitourinary: Negative for dysuria, flank pain, vaginal bleeding, vaginal discharge, difficulty urinating,  vaginal pain and pelvic pain.  Neurological: Negative for dizziness and headaches.  Psychiatric/Behavioral: Negative.      I have reviewed patient's Past Medical Hx, Surgical Hx, Family Hx, Social Hx, medications and allergies.   Physical Exam   Patient Vitals for the past 24 hrs:  BP Temp Temp src Pulse Resp SpO2  11/12/15 1845 - - - (!) 133 - 95 %  11/12/15 1823 - - - (!) 130 - 100 %  11/12/15 1818 - - - (!) 128 - 99 %  11/12/15 1813 - - - (!) 129 - 99 %  11/12/15 1808 - - - (!) 124 - 91 %  11/12/15 1803 - - - (!) 129 - 99 %  11/12/15 1758 - - - (!) 128 - 99 %  11/12/15 1748 - - - (!) 128 - 99 %  11/12/15 1743 - - - (!) 128 - 100 %  11/12/15 1733 - - - (!) 130 - 99 %  11/12/15 1710 - - - (!) 125 - 100 %  11/12/15 1649 - - - (!) 131 - 98 %  11/12/15 1547 - - - (!) 132 - 98 %  11/12/15 1542 - - - (!) 133 - 98 %  11/12/15 1537 - - - (!) 127 - 99 %  11/12/15 1522 - - - (!) 133 - 99 %  11/12/15 1517 - - - Marland Kitchen)  128 - 98 %  11/12/15 1512 - - - (!) 131 - 99 %  11/12/15 1507 - - - (!) 134 - 98 %  11/12/15 1232 110/76 mmHg 98 F (36.7 C) Oral (!) 138 20 -   Constitutional: Well-developed, well-nourished female in no acute distress.  Cardiovascular: normal rate Respiratory: normal effort GI: Abd soft, non-tender, gravid appropriate for gestational age.  MS: Extremities nontender, no edema, normal ROM Neurologic: Alert and oriented x 4.  GU: Neg CVAT.      FHT:  Baseline 155, moderate variability, accelerations present, no decelerations Contractions: None on toco or to palpation   Labs: Results for orders placed or performed during the hospital encounter of 11/12/15 (from the past 24 hour(s))  Urinalysis, Routine w reflex microscopic ([facility])     Status: Abnormal   Collection Time: 11/12/15 12:45 PM  Result Value Ref Range   Color, Urine YELLOW YELLOW   APPearance CLEAR CLEAR   Specific Gravity, Urine 1.020 1.005 - 1.030   pH 6.5 5.0 - 8.0   Glucose, UA NEGATIVE  NEGATIVE mg/dL   Hgb urine dipstick TRACE (A) NEGATIVE   Bilirubin Urine NEGATIVE NEGATIVE   Ketones, ur 40 (A) NEGATIVE mg/dL   Protein, ur 30 (A) NEGATIVE mg/dL   Nitrite NEGATIVE NEGATIVE   Leukocytes, UA NEGATIVE NEGATIVE  Urine microscopic-add on     Status: Abnormal   Collection Time: 11/12/15 12:45 PM  Result Value Ref Range   Squamous Epithelial / LPF 0-5 (A) NONE SEEN   WBC, UA 0-5 0 - 5 WBC/hpf   RBC / HPF 0-5 0 - 5 RBC/hpf   Bacteria, UA MANY (A) NONE SEEN  Rapid strep screen     Status: Abnormal   Collection Time: 11/12/15  4:06 PM  Result Value Ref Range   Streptococcus, Group A Screen (Direct) POSITIVE (A) NEGATIVE  CBC     Status: Abnormal   Collection Time: 11/12/15  5:28 PM  Result Value Ref Range   WBC 18.5 (H) 4.0 - 10.5 K/uL   RBC 4.16 3.87 - 5.11 MIL/uL   Hemoglobin 10.8 (L) 12.0 - 15.0 g/dL   HCT 29.532.6 (L) 62.136.0 - 30.846.0 %   MCV 78.4 78.0 - 100.0 fL   MCH 26.0 26.0 - 34.0 pg   MCHC 33.1 30.0 - 36.0 g/dL   RDW 65.714.9 84.611.5 - 96.215.5 %   Platelets 187 150 - 400 K/uL  Comprehensive metabolic panel     Status: Abnormal   Collection Time: 11/12/15  5:28 PM  Result Value Ref Range   Sodium 136 135 - 145 mmol/L   Potassium 3.5 3.5 - 5.1 mmol/L   Chloride 106 101 - 111 mmol/L   CO2 20 (L) 22 - 32 mmol/L   Glucose, Bld 98 65 - 99 mg/dL   BUN <5 (L) 6 - 20 mg/dL   Creatinine, Ser 9.520.47 0.44 - 1.00 mg/dL   Calcium 8.2 (L) 8.9 - 10.3 mg/dL   Total Protein 6.1 (L) 6.5 - 8.1 g/dL   Albumin 2.5 (L) 3.5 - 5.0 g/dL   AST 17 15 - 41 U/L   ALT 13 (L) 14 - 54 U/L   Alkaline Phosphatase 103 38 - 126 U/L   Total Bilirubin 0.4 0.3 - 1.2 mg/dL   GFR calc non Af Amer >60 >60 mL/min   GFR calc Af Amer >60 >60 mL/min   Anion gap 10 5 - 15      Imaging:  No results found.  MAU Course/MDM: I have ordered labs and reviewed results. Reviewed FHR tracing. Pt afebrile in MAU with report of sore throat without other URI symptoms.  Evaluation of throat consistent with pharyngitis.   Unlikely influenza, but no obvious signs of strep throat at this time.  No shortness of breath, no chest pain. Will discharge with symptom management but call pt tonight with results of flu/strep swabs.  Increase PO fluids.  Zantac for heartburn.  Return to MAU if symptoms not improved in 48 hours.  Treatments in MAU included LR x 1000 ml, D5LR x 1000 ml, EKG, GI Cocktail, Tylenol 650 mg PO .  Pt stable at time of discharge.  Assessment: 1. Upper respiratory infection, acute   2. [redacted] weeks gestation of pregnancy   3. Tachycardia     Plan: Discharge home OTC medications for symptom treatment Zantac 150 mg PO BID Increase PO fluids Preterm labor precautions and fetal kick counts Influenza and Strep swabs pending      Follow-up Information    Follow up with [facility].   Why:  As scheduled, Return to MAU as needed for emergencies, Regresar a MAU segn sea necesario para emergencia   Contact information:   [address] [place] [address] [phone]        Medication List    TAKE these medications        ranitidine 150 MG tablet  Commonly known as:  ZANTAC  Take 1 tablet (150 mg total) by mouth 2 (two) times daily.        Sharen Counter Certified Nurse-Midwife 11/12/2015 7:38 PM

## 2015-11-12 NOTE — Progress Notes (Signed)
Flu test all negative  Throat culture + for Group A strep  Rx for Penicillin V 500mg  qid x 7 days  [facility]  Interpretor used to speak with patient.

## 2015-11-12 NOTE — MAU Provider Note (Deleted)
  History     CSN: [id number]  Arrival date and time: 11/12/15 1205   First Provider Initiated Contact with Patient 11/12/15 1339      Chief Complaint  Patient presents with  . Emesis   HPI  Natalie Armstrong is a 25 yo G2P1001 at 7957w1d who presents with 1 day hx of nausea, emesis, headache, and sore throat. Patient states that she feels sick. No sick contacts. Denies any nasal congestion, runny nose,   OB History    Gravida Para Term Preterm AB TAB SAB Ectopic Multiple Living   2 1 1       1       Past Medical History  Diagnosis Date  . Chronic kidney disease     Past Surgical History  Procedure Laterality Date  . Cesarean section    . Nephrectomy    . Cholecystectomy      History reviewed. No pertinent family history.  Social History  Substance Use Topics  . Smoking status: Never Smoker   . Smokeless tobacco: None  . Alcohol Use: No    Allergies: Allergies not on file  No prescriptions prior to admission    ROS Physical Exam   Blood pressure 110/76, pulse 138, temperature 98 F (36.7 C), temperature source Oral, resp. rate 20.  Physical Exam  MAU Course  Procedures  MDM   Assessment and Plan    Natalie Armstrong 11/12/2015, 2:12 PM

## 2015-11-12 NOTE — MAU Note (Addendum)
Pt C/O fever last night & this morning, didn't take temp but felt hot & then cold chills.  Vomited twice during the night, denies diarrhea.  Denies uc's, bleeding or LOF.  Temp WNL in triage, pt states she took tylenol @ 1030.  Also C/O sore throat, no cough.  Pt thinks she has the flu.

## 2015-11-12 NOTE — Discharge Instructions (Signed)
Infeccin del tracto respiratorio superior, adultos (Upper Respiratory Infection, Adult) La mayora de las infecciones del tracto respiratorio superior son infecciones virales de las vas que llevan el aire a los pulmones. Un infeccin del tracto respiratorio superior afecta la nariz, la garganta y las vas respiratorias superiores. El tipo ms frecuente de infeccin del tracto respiratorio superior es la nasofaringitis, que habitualmente se conoce como "resfro comn". Las infecciones del tracto respiratorio superior siguen su curso y por lo general se curan solas. En la [employer], la infeccin del tracto respiratorio superior no requiere atencin [name], Armed forces training and education officer a veces, despus de una infeccin viral, puede surgir una infeccin bacteriana en las vas respiratorias superiores. Esto se conoce como infeccin secundaria. Las infecciones sinusales y en el odo medio son tipos frecuentes de infecciones secundarias en el tracto respiratorio superior. La neumona bacteriana tambin puede complicar un cuadro de infeccin del tracto respiratorio superior. Este tipo de infeccin puede empeorar el asma y la enfermedad pulmonar obstructiva crnica (EPOC). En algunos casos, estas complicaciones pueden requerir atencin mdica de emergencia y poner en peligro la vida.  CAUSAS Casi todas las infecciones del tracto respiratorio superior se deben a los virus. Un virus es un tipo de microbio que puede contagiarse de Natalie Armstrong persona a Natalie Armstrong.  FACTORES DE RIESGO Puede estar en riesgo de sufrir una infeccin del tracto respiratorio superior si:   Fuma.  Tiene una enfermedad pulmonar o cardaca crnica.  Tiene debilitado el sistema de defensa (inmunitario) del cuerpo.  Es muy joven o de edad muy [name].  Tiene asma o alergias nasales.  Trabaja en reas donde hay mucha gente o poca ventilacin.  Natalie Armstrong en una escuela o en un centro de atencin mdica. SIGNOS Y SNTOMAS  Habitualmente, los sntomas aparecen  de 2a 3das despus de entrar en contacto con el virus del resfro. La mayora de las infecciones virales en el tracto respiratorio superior duran de 7a 10das. Sin embargo, las infecciones virales en el tracto respiratorio superior a causa del virus de la gripe pueden durar de 14a 18das y, habitualmente, son ms graves. Entre los sntomas se pueden incluir los siguientes:   Secrecin o congestin nasal.  Estornudos.  Tos.  Dolor de Investment banker, operational.  Dolor de [place].  Fatiga.  Cristy Hilts.  Prdida del apetito.  Dolor en la frente, detrs de los ojos y por encima de los pmulos (dolor sinusal).  Dolores musculares. DIAGNSTICO  El mdico puede diagnosticar una infeccin del tracto respiratorio superior mediante los siguientes estudios:  Examen fsico.  Pruebas para verificar si los sntomas no se deben a otra afeccin, por ejemplo:  Faringitis estreptoccica.  Sinusitis.  Neumona.  Asma. TRATAMIENTO  Esta infeccin desaparece sola, con el tiempo. No puede curarse con medicamentos, pero a menudo se prescriben para aliviar los sntomas. Los medicamentos pueden ser tiles para lo siguiente:  Engineer, materials fiebre.  Reducir la tos.  Aliviar la congestin nasal. INSTRUCCIONES PARA EL CUIDADO EN EL HOGAR   Tome los medicamentos solamente como se lo haya indicado el mdico.  A fin de Best boy de garganta, haga grgaras con solucin salina templada o consuma caramelos para la tos, como se lo haya indicado el mdico.  Use un humidificador de vapor clido o inhale el vapor de la ducha para aumentar la humedad del aire. Esto facilitar la respiracin.  Beba suficiente lquido para Consulting civil engineer orina clara o de color amarillo plido.  Consuma sopas y otros caldos transparentes, y [employer].  Descanse todo lo  que sea necesario.  Regrese al [name] cuando la temperatura se le haya normalizado o cuando el mdico lo autorice. Es posible que deba quedarse en su casa durante  un tiempo prolongado, para no infectar a los dems. Tambin puede usar un barbijo y lavarse las manos con cuidado para Natalie Armstrong la propagacin del virus.  Aumente el uso del inhalador si tiene asma.  No consuma ningn producto que contenga tabaco, lo que incluye cigarrillos, tabaco de Natalie managermascar o Administrator, Civil Servicecigarrillos electrnicos. Si necesita ayuda para dejar de fumar, consulte al [name]. PREVENCIN  La mejor manera de protegerse de un resfro es mantener una higiene Natalie Armstrong.   Evite el contacto oral o fsico con personas que tengan sntomas de resfro.  En caso de contacto, lvese las manos con frecuencia. No hay pruebas claras de que la vitaminaC, la vitaminaE, la equincea o el ejercicio reduzcan la probabilidad de Natalie school teachercontraer un resfro. Sin embargo, siempre se recomienda Natalie account managerdescansar mucho, hacer ejercicio y Engineering geologistalimentarse bien.  SOLICITE ATENCIN MDICA SI:   Su estado empeora en lugar de mejorar.  Los medicamentos no [name] controlar los sntomas.  Tiene escalofros.  La sensacin de falta de aire empeora.  Tiene mucosidad marrn o roja.  Tiene secrecin nasal amarilla o marrn.  Le duele la cara, especialmente al inclinarse hacia adelante.  Tiene fiebre.  Tiene los ganglios del cuello hinchados.  Siente dolor al tragar.  Tiene zonas blancas en la parte de atrs de la garganta. SOLICITE ATENCIN MDICA DE INMEDIATO SI:   Tiene sntomas intensos o persistentes de:  Dolor de [name].  Dolor de odos.  Dolor sinusal.  Dolor en el pecho.  Tiene enfermedad pulmonar crnica y cualquiera de estos sntomas:  Sibilancias.  Tos prolongada.  Tos con sangre.  Cambio en la mucosidad habitual.  Presenta rigidez en el cuello.  Tiene cambios en:  La visin.  La audicin.  El pensamiento.  El [place]estado de nimo. ASEGRESE DE QUE:   Comprende estas instrucciones.  Controlar su afeccin.  Recibir ayuda de inmediato si no mejora o si empeora.   Esta informacin no tiene Public house managercomo  fin reemplazar el consejo del mdico. Asegrese de hacerle al mdico cualquier pregunta que tenga.   Document Released: 05/19/2005 Document Revised: 12/24/2014 Elsevier Interactive Patient Education 2016 [employer]  Acidez de estmago durante el embarazo  (Heartburn During Pregnancy) Natalie BoozeLa acidez es la sensacin de ardor en el pecho que se siente cuando el cido del estmago vuelve haca el esfago. La acidez es frecuente en el embarazo debido a la liberacin de cierta hormona (progesterona). La progesterona relaja la vlvula que separa el esfago del Louisburgestmago. Esto hace que el cido suba al esfago y cause acidez. Tambin puede ocurrir Visual merchandiseren el embarazo debido a que el tero al agrandarse empuja el estmago, lo que hace que suba ms cido al esfago. Esto se produce especialmente en las ltimas etapas del embarazo. La acidez generalmente desaparece despus del parto. CAUSAS  La acidez se siente cuando el cido del estmago vuelve hacia el esfago. Durante el Olatheembarazo, puede ser causada por distintas cosas, por ejemplo:   La hormona progesterona.  Cambios en los niveles hormonales.  El tero crece y [address]empuja el cido del estmago [place] arriba.  Comidas abundantes  Ciertos alimentos y [address]be[address]as  Haga actividad fsica.  Aumento en la produccin de cido SIGNOS Y SNTOMAS   Sensacin de ardor en el pecho o en la parte inferior de la garganta.  Sabor amargo en la boca.  Tos. DIAGNSTICO  El  mdico diagnostica la acidez con una historia clnica cuidadosa en la que pregunta por sus molestias. Le indicar anlisis de sangre para Clinical research associate cierto tipo de bacteria que se asocia con la [place]. En algunos casos se diagnostica recetando un medicamento para calmar la acidez y viendo si los sntomas mejoran. En algunos casos, se realiza un procedimiento llamado endoscopa. En este procedimiento se [place] un tubo con Neomia Dear luz y Posey Boyer en un extremo (endoscopio) , y se examina el esfago y Water quality scientist. TRATAMIENTO  El tratamiento variar segn la gravedad de los sntomas. El mdico podr indicar:  Medicamentos de Sales promotion account executive (anticidos, medicamentos para Conservator, museum/gallery Engineering geologist) en los casos de acidez leve.  Medicamentos recetados para disminuir el cido estomacal o para proteger la superficie del [place].  Ciertos cambios en la dieta.  Elevacin de la cabecera de la cama colocando bloques debajo de las patas. De esta manera evitar que el cido del estmago vuelva al esfago mientras est recostado. INSTRUCCIONES PARA EL CUIDADO EN EL HOGAR   Tome slo medicamentos de venta libre o recetados, segn las indicaciones del mdico.  Eleve la cabecera de la cama colocando bloques debajo de las patas, si el mdico lo aconsej. Usar ms almohadas al dormir no es Secretary/administrator porque solo modificara la posicin de la cabeza.  No  haga ejercicios enseguida despus de comer.  Evite comer 2 o 3horas antes de irse a dormir. No se acueste enseguida despus de comer.  Haga comidas pequeas Freight forwarder de tres comidas abundantes.  Identifique los alimentos o las bebidas que empeoran sus sntomas y evtelos. Los alimentos que debe evitar son:  [place].  Chocolate.  Alimentos con alto contenido de grasas, incluyendo las comidas fritas  Comidas muy condimentadas.  Ajo y [address]  Ctricos, como naranja, pomelo, limn y lima  Alimentos o productos que contengan tomate  Menta.  Bebidas gaseosas y con cafena.  [place] SOLICITE ATENCIN MDICA SI:  Tiene cualquier tipo de dolor abdominal.  Siente ardor en la parte superior del abdomen o el pecho, especialmente despus de comer o mientras est acostada.  Tiene nuseas o vmitos.  Siente malestar estomacal despus de comer. SOLICITE ATENCIN MDICA DE INMEDIATO SI:   Siente un dolor intenso en el pecho que baja por el brazo o va hacia al mandbula o el cuello.  Se siente mareado o sufre un desmayo.  Comienza a  sentir falta de aire.  Vomita sangre.  Tiene dificultad o dolor al tragar.  La materia fecal es negra, de aspecto alquitranado.  Tiene acidez ms de 3 veces por semana, durante ms de 2 semanas. ASEGRESE DE QUE:  Comprende estas instrucciones.  Controlar su afeccin.  Recibir ayuda de inmediato si no mejora o si empeora.   Esta informacin no tiene Theme park Armstrong el consejo del mdico. Asegrese de hacerle al mdico cualquier pregunta que tenga.   Document Released: 05/19/2005 Document Revised: 08/30/2014 Elsevier Interactive Patient Education [employer]

## 2015-12-22 LAB — OB RESULTS CONSOLE GBS: GBS: POSITIVE

## 2016-01-20 ENCOUNTER — Encounter (HOSPITAL_COMMUNITY): Payer: Self-pay | Admitting: *Deleted

## 2016-01-20 ENCOUNTER — Telehealth (HOSPITAL_COMMUNITY): Payer: Self-pay | Admitting: *Deleted

## 2016-01-20 NOTE — Telephone Encounter (Signed)
Preadmission screen Interpreter number [phone]

## 2016-01-21 ENCOUNTER — Inpatient Hospital Stay (HOSPITAL_COMMUNITY)
Admission: RE | Admit: 2016-01-21 | Discharge: 2016-01-26 | DRG: 765 | Disposition: A | Discharge status: Home / Self Care | Payer: Medicaid Other | Source: Ambulatory Visit | Attending: Obstetrics & Gynecology | Admitting: Obstetrics & Gynecology

## 2016-01-21 ENCOUNTER — Encounter (HOSPITAL_COMMUNITY): Payer: Self-pay

## 2016-01-21 VITALS — BP 113/67 | HR 82 | Temp 97.9°F | Resp 19 | Ht 59.0 in | Wt 219.0 lb

## 2016-01-21 DIAGNOSIS — N189 Chronic kidney disease, unspecified: Secondary | ICD-10-CM | POA: Diagnosis present

## 2016-01-21 DIAGNOSIS — Z6841 Body Mass Index (BMI) 40.0 and over, adult: Secondary | ICD-10-CM | POA: Diagnosis not present

## 2016-01-21 DIAGNOSIS — O9902 Anemia complicating childbirth: Secondary | ICD-10-CM | POA: Diagnosis present

## 2016-01-21 DIAGNOSIS — D649 Anemia, unspecified: Secondary | ICD-10-CM | POA: Diagnosis present

## 2016-01-21 DIAGNOSIS — O480 Post-term pregnancy: Principal | ICD-10-CM | POA: Diagnosis present

## 2016-01-21 DIAGNOSIS — O99019 Anemia complicating pregnancy, unspecified trimester: Secondary | ICD-10-CM | POA: Diagnosis present

## 2016-01-21 DIAGNOSIS — O99824 Streptococcus B carrier state complicating childbirth: Secondary | ICD-10-CM | POA: Diagnosis present

## 2016-01-21 DIAGNOSIS — Z905 Acquired absence of kidney: Secondary | ICD-10-CM | POA: Diagnosis not present

## 2016-01-21 DIAGNOSIS — O34219 Maternal care for unspecified type scar from previous cesarean delivery: Secondary | ICD-10-CM | POA: Diagnosis not present

## 2016-01-21 DIAGNOSIS — O26833 Pregnancy related renal disease, third trimester: Secondary | ICD-10-CM | POA: Diagnosis present

## 2016-01-21 DIAGNOSIS — O34211 Maternal care for low transverse scar from previous cesarean delivery: Secondary | ICD-10-CM | POA: Diagnosis present

## 2016-01-21 DIAGNOSIS — Z3A41 41 weeks gestation of pregnancy: Secondary | ICD-10-CM | POA: Diagnosis not present

## 2016-01-21 DIAGNOSIS — O99214 Obesity complicating childbirth: Secondary | ICD-10-CM | POA: Diagnosis present

## 2016-01-21 DIAGNOSIS — Z98891 History of uterine scar from previous surgery: Secondary | ICD-10-CM

## 2016-01-21 HISTORY — DX: Post-term pregnancy: O48.0

## 2016-01-21 HISTORY — DX: 41 weeks gestation of pregnancy: Z3A.41

## 2016-01-21 LAB — TYPE AND SCREEN
ABO/RH(D): O POS
ANTIBODY SCREEN: NEGATIVE

## 2016-01-21 LAB — CBC
HEMATOCRIT: 34.8 % — AB (ref 36.0–46.0)
HEMOGLOBIN: 11.8 g/dL — AB (ref 12.0–15.0)
MCH: 26.1 pg (ref 26.0–34.0)
MCHC: 33.9 g/dL (ref 30.0–36.0)
MCV: 77 fL — ABNORMAL LOW (ref 78.0–100.0)
Platelets: 216 10*3/uL (ref 150–400)
RBC: 4.52 MIL/uL (ref 3.87–5.11)
RDW: 15 % (ref 11.5–15.5)
WBC: 10.1 10*3/uL (ref 4.0–10.5)

## 2016-01-21 LAB — ABO/RH: ABO/RH(D): O POS

## 2016-01-21 MED ORDER — OXYTOCIN 40 UNITS IN LACTATED RINGERS INFUSION - SIMPLE MED: 2.5000 [IU]/h | INTRAVENOUS | Status: DC

## 2016-01-21 MED ORDER — ACETAMINOPHEN 325 MG PO TABS: 650.0000 mg | ORAL_TABLET | ORAL | Status: DC | PRN

## 2016-01-21 MED ORDER — TERBUTALINE SULFATE 1 MG/ML IJ SOLN: 0.2500 mg | Freq: Once | INTRAMUSCULAR | Status: DC | PRN

## 2016-01-21 MED ORDER — OXYTOCIN BOLUS FROM INFUSION: 500.0000 mL | INTRAVENOUS | Status: DC

## 2016-01-21 MED ORDER — LIDOCAINE HCL (PF) 1 % IJ SOLN: 30.0000 mL | INTRAMUSCULAR | Status: DC | PRN

## 2016-01-21 MED ORDER — ONDANSETRON HCL 4 MG/2ML IJ SOLN
4.0000 mg | Freq: Four times a day (QID) | INTRAMUSCULAR | Status: DC | PRN
  Administered 2016-01-23 (×2): 4 mg via INTRAVENOUS
  Filled 2016-01-21: qty 2

## 2016-01-21 MED ORDER — LACTATED RINGERS IV SOLN
INTRAVENOUS | Status: DC
  Administered 2016-01-21 – 2016-01-23 (×6): via INTRAVENOUS

## 2016-01-21 MED ORDER — LACTATED RINGERS IV SOLN: 500.0000 mL | INTRAVENOUS | Status: DC | PRN

## 2016-01-21 MED ORDER — PENICILLIN G POTASSIUM 5000000 UNITS IJ SOLR
2.5000 10*6.[IU] | INTRAMUSCULAR | Status: DC
  Administered 2016-01-22 – 2016-01-23 (×8): 2.5 10*6.[IU] via INTRAVENOUS
  Filled 2016-01-21 (×16): qty 2.5

## 2016-01-21 MED ORDER — PENICILLIN G POTASSIUM 5000000 UNITS IJ SOLR
5.0000 10*6.[IU] | Freq: Once | INTRAVENOUS | Status: AC
  Administered 2016-01-22: 5 10*6.[IU] via INTRAVENOUS
  Filled 2016-01-21: qty 5

## 2016-01-21 MED ORDER — SOD CITRATE-CITRIC ACID 500-334 MG/5ML PO SOLN
30.0000 mL | ORAL | Status: DC | PRN
  Filled 2016-01-21: qty 15

## 2016-01-21 MED ORDER — OXYTOCIN 40 UNITS IN LACTATED RINGERS INFUSION - SIMPLE MED
1.0000 m[IU]/min | INTRAVENOUS | Status: DC
  Administered 2016-01-21: 1 m[IU]/min via INTRAVENOUS
  Filled 2016-01-21: qty 1000

## 2016-01-21 NOTE — H&P (Signed)
OBSTETRIC ADMISSION HISTORY AND PHYSICAL  Natalie Armstrong is a 25 y.o. female G2P1001 with IUP at [redacted]w[redacted]d by LMP presenting for PD-IOL. She reports +FMs, No LOF, no VB, no blurry vision, headaches or peripheral edema, and RUQ pain.  She plans on both formula and breast feeding. She request undecided for birth control.  Dating: By LMP--->  Estimated Date of Delivery: 01/13/16  Pregnancy complicated by  1. Anemia 2. H/o nephrectomy   Prenatal History/Complications:  Past Medical History: Past Medical History  Diagnosis Date  . Chronic kidney disease   . Anemia   . Obesity     Past Surgical History: Past Surgical History  Procedure Laterality Date  . Cesarean section    . Nephrectomy    . Cholecystectomy      Obstetrical History: OB History    Gravida Para Term Preterm AB TAB SAB Ectopic Multiple Living   Social History: Social History   Social History  . Marital Status: Single    Spouse Name: N/A  . Number of Children: N/A  . Years of Education: N/A   Social History Main Topics  . Smoking status: Never Smoker   . Smokeless tobacco: None  . Alcohol Use: No  . Drug Use: No  . Sexual Activity: Yes   Other Topics Concern  . None   Social History Narrative    Family History: History reviewed. No pertinent family history.  Allergies: No Known Allergies  Prescriptions prior to admission  Medication Sig Dispense Refill Last Dose  . penicillin v potassium (VEETID) 500 MG tablet Take 1 tablet (500 mg total) by mouth 4 (four) times daily. 28 tablet 0   . ranitidine (ZANTAC) 150 MG tablet Take 1 tablet (150 mg total) by mouth 2 (two) times daily. 60 tablet 2      Review of Systems   All systems reviewed and negative except as stated in HPI  There were no vitals taken for this visit. General appearance: alert, cooperative and appears stated age Lungs: clear to auscultation bilaterally Heart: regular rate and rhythm Abdomen: soft,  non-tender; bowel sounds normal Pelvic: adequate Extremities: Homans sign is negative, no sign of DVT DTR's wnl Presentation: cephalic Fetal monitoringBaseline: 145 bpm, Variability: Good {> 6 bpm), Accelerations: Reactive and Decelerations: Absent Uterine activityNone     Prenatal labs: ABO, Rh: O/Positive/-- (01/03 0000) Antibody: Negative (01/03 0000) Rubella: Immune (01/03 0000) RPR: Nonreactive (01/03 0000)  HBsAg: Negative (01/03 0000)  HIV: Non-reactive (01/03 0000)  GBS: Positive (05/01 0000)  1 hr Glucola  Early=116, 3rd trimester= 136 3hr wnl Genetic screening  wnl Anatomy US wnl  Prenatal Transfer Tool  Maternal Diabetes: No Genetic Screening: Normal Maternal Ultrasounds/Referrals: Normal Fetal Ultrasounds or other Referrals:  None Maternal Substance Abuse:  No Significant Maternal Medications:  None Significant Maternal Lab Results: Lab values include: Group B Strep positive  No results found for this or any previous visit (from the past 24 hour(s)).  Patient Active Problem List   Diagnosis Date Noted  . Post term pregnancy, 41 weeks 01/21/2016    Assessment/Plan:  Natalie Armstrong is a 25 y.o. G2P1001 at [redacted]w[redacted]d here for PD-IOL  #Labor: IOL for postdates in the setting of prior CS. Will start low dose pitocin for ripening. Plan on FB when appropriate #Pain: Prn medication #FWB: Cat I #ID:  GBS pos, will start PCN with FB #MOF: breast #MOC: undecided, discussed with patient and  she knows her options #Circ:  NA, female.   Federico FlakeKimberly Niles Newton, MD  01/21/2016, 11:07 AM

## 2016-01-21 NOTE — Progress Notes (Signed)
I stopped to check on pt need I ordered her meals. During my shift I assisted Virgina  CNM, and Scientist, clinical (histocompatibility and immunogenetics)eter Medicine student with explanation of care and I assisted with interpretetation a Scientist, clinical (histocompatibility and immunogenetics)CRNA, by Orlan LeavensViria Alvarez Spanish Interpreter.

## 2016-01-21 NOTE — Anesthesia Pain Management Evaluation Note (Signed)
  CRNA Pain Management Visit Note  Patient: Natalie Armstrong, 25 y.o., female  "Hello I am a member of the anesthesia team at [facility]. We have an anesthesia team available at all times to provide care throughout the hospital, including epidural management and anesthesia for C-section. I don't know your plan for the delivery whether it a natural birth, water birth, IV sedation, nitrous supplementation, doula or epidural, but we want to meet your pain goals."   1.Was your pain managed to your expectations on prior hospitalizations?   Yes   2.What is your expectation for pain management during this hospitalization?     Labor support without medications, not sure of plan.  3.How can we help you reach that goal? Undecided.  Record the patient's initial score and the patient's pain goal.   Pain: 5  Pain Goal: 8 [facility] wants you to be able to say your pain was always managed very well.  Alfreddie Consalvo 01/21/2016

## 2016-01-22 ENCOUNTER — Inpatient Hospital Stay (HOSPITAL_COMMUNITY): Payer: Medicaid Other | Admitting: Anesthesiology

## 2016-01-22 LAB — RPR: RPR: NONREACTIVE

## 2016-01-22 MED ORDER — PENICILLIN G POTASSIUM 5000000 UNITS IJ SOLR
5.0000 10*6.[IU] | Freq: Once | INTRAVENOUS | Status: DC
  Filled 2016-01-22: qty 5

## 2016-01-22 MED ORDER — PHENYLEPHRINE 40 MCG/ML (10ML) SYRINGE FOR IV PUSH (FOR BLOOD PRESSURE SUPPORT)
80.0000 ug | PREFILLED_SYRINGE | INTRAVENOUS | Status: DC | PRN
  Filled 2016-01-22: qty 10

## 2016-01-22 MED ORDER — FENTANYL CITRATE (PF) 100 MCG/2ML IJ SOLN
100.0000 ug | INTRAMUSCULAR | Status: DC | PRN
Start: 2016-01-22 — End: 2016-01-23
  Administered 2016-01-22 (×2): 100 ug via INTRAVENOUS
  Filled 2016-01-22: qty 2

## 2016-01-22 MED ORDER — EPHEDRINE 5 MG/ML INJ: 10.0000 mg | INTRAVENOUS | Status: DC | PRN

## 2016-01-22 MED ORDER — DIPHENHYDRAMINE HCL 50 MG/ML IJ SOLN: 12.5000 mg | INTRAMUSCULAR | Status: DC | PRN

## 2016-01-22 MED ORDER — OXYTOCIN 40 UNITS IN LACTATED RINGERS INFUSION - SIMPLE MED
1.0000 m[IU]/min | INTRAVENOUS | Status: DC
  Administered 2016-01-23: 6 m[IU]/min via INTRAVENOUS
  Filled 2016-01-22: qty 1000

## 2016-01-22 MED ORDER — LACTATED RINGERS IV SOLN: 500.0000 mL | Freq: Once | INTRAVENOUS | Status: DC

## 2016-01-22 MED ORDER — FENTANYL CITRATE (PF) 100 MCG/2ML IJ SOLN
INTRAMUSCULAR | Status: AC
  Filled 2016-01-22: qty 2

## 2016-01-22 MED ORDER — PHENYLEPHRINE 40 MCG/ML (10ML) SYRINGE FOR IV PUSH (FOR BLOOD PRESSURE SUPPORT): 80.0000 ug | PREFILLED_SYRINGE | INTRAVENOUS | Status: DC | PRN

## 2016-01-22 MED ORDER — FENTANYL 2.5 MCG/ML BUPIVACAINE 1/10 % EPIDURAL INFUSION (WH - ANES)
14.0000 mL/h | INTRAMUSCULAR | Status: DC | PRN
  Administered 2016-01-22 – 2016-01-23 (×3): 14 mL/h via EPIDURAL
  Filled 2016-01-22 (×2): qty 125

## 2016-01-22 MED ORDER — PENICILLIN G POTASSIUM 5000000 UNITS IJ SOLR
2.5000 10*6.[IU] | INTRAVENOUS | Status: DC
  Filled 2016-01-22 (×4): qty 2.5

## 2016-01-22 NOTE — Progress Notes (Signed)
   Subjective: Pt reports increased pain and contractions.  Also reports feeling like urinated while on toilet.    Objective: BP 110/50 mmHg  Pulse 108  Temp(Src) 98.8 F (37.1 C) (Oral)  Resp 18  Ht 4\' 11"  (1.499 m)  Wt 219 lb (99.338 kg)  BMI 44.21 kg/m2      FHT:  FHR: 130's bpm, variability: moderate,  accelerations:  Present,  decelerations:  Absent UC:   2-6 6/50/ballotable Membranes palpated  Labs: Lab Results  Component Value Date   WBC 10.1 01/21/2016   HGB 11.8* 01/21/2016   HCT 34.8* 01/21/2016   MCV 77.0* 01/21/2016   PLT 216 01/21/2016    Assessment / Plan: Augmentation of labor, progressing well  Labor: Progressing normally Preeclampsia:  n/a Fetal Wellbeing:  Category I Pain Control:  Labor support without medications; pt desires epidural later, will notify staff I/D:  receiving penicilln Anticipated MOD:  NSVD  KARIM, WALIDAH N 01/22/2016, 2:15 PM

## 2016-01-22 NOTE — Progress Notes (Signed)
Labor Progress Note Natalie PortsKenia Armstrong is a [age] y.o. G2P1001 at 429w2d presented for PDIOL S:   O:  BP 121/71 mmHg  Pulse 102  Temp(Src) 98 F (36.7 C) (Oral)  Resp 18  Ht 4\' 11"  (1.499 m)  Wt 219 lb (99.338 kg)  BMI 44.21 kg/m2 EFM: 150/Mod var/no decels  CVE: Dilation: Fingertip Effacement (%): Thick Cervical Position: Middle Station: Ballotable Presentation: Vertex Exam by:: Ivonne AndrewV Smith, CNM   A&P: 25 y.o. G2P1001 649w2d admitted for PDIOL #Labor: FB in place. Continue pit #Pain: as needed meds #FWB: CAT-1 #GBS: pcn for GBS  Almon Herculesaye T Gonfa, MD 7:33 AM

## 2016-01-22 NOTE — Progress Notes (Addendum)
L&D Note  01/22/2016 - 10:18 AM  25 y.o. G2P1001 [redacted]w[redacted]d (EDC 5/23). Pregnancy complicated by h/o term c-section, BMI 44, GBS pos, h/o pre-x, h/o nephrectomy  Ms. Dinora Hemm is admitted for IOL for TOLAC   Subjective:  Feeling regular UCs, non painful   Objective:   Filed Vitals:   01/22/16 0800 01/22/16 0809 01/22/16 0901 01/22/16 1001  BP:  123/71 123/65 122/66  Pulse:  114 101 110  Temp: 98.5 F (36.9 C)   98.8 F (37.1 C)  TempSrc: Oral   Oral  Resp: Height:      Weight:        Current Vital Signs 24h Vital Sign Ranges  T 98.8 F (37.1 C) Temp  Avg: 98.6 F (37 C)  Min: 98 F (36.7 C)  Max: 98.8 F (37.1 C)  BP 122/66 mmHg BP  Min: 100/64  Max: 151/82  HR (!) 110 Pulse  Avg: 103.4  Min: 92  Max: 118  RR 20 Resp  Avg: 20  Min: 18  Max: 25  SaO2   Not Delivered No Data Recorded       24 Hour I/O Current Shift I/O  Time Ins Outs       FHR: 155 baseline, +accels, no decels, mod var Toco: q85m Gen: NAD SVE: deferred. FB in place with tugging.   Labs:  No new labs  Medications Current Facility-Administered Medications  Medication Dose Route Frequency Provider Last Rate Last Dose  . acetaminophen (TYLENOL) tablet 650 mg  650 mg Oral Q4H PRN Kathrynn Running, MD      . fentaNYL (SUBLIMAZE) injection 100 mcg  100 mcg Intravenous Q1H PRN Dorathy Kinsman, CNM   100 mcg at 01/22/16 0324  . lactated ringers infusion 500-1,000 mL  500-1,000 mL Intravenous PRN Kathrynn Running, MD      . lactated ringers infusion   Intravenous Continuous Kathrynn Running, MD 125 mL/hr at 01/22/16 0326    . lidocaine (PF) (XYLOCAINE) 1 % injection 30 mL  30 mL Subcutaneous PRN Kathrynn Running, MD      . ondansetron [facility]) injection 4 mg  4 mg Intravenous Q6H PRN Kathrynn Running, MD      . oxytocin (PITOCIN) IV BOLUS FROM BAG  500 mL Intravenous Continuous Kathrynn Running, MD      . oxytocin (PITOCIN) IV infusion 40 units in LR 1000 mL - Premix  2.5 Units/hr  Intravenous Continuous Kathrynn Running, MD      . oxytocin (PITOCIN) IV infusion 40 units in LR 1000 mL - Premix  1-6 milli-units/min Intravenous Titrated Federico Flake, MD 9 mL/hr at 01/21/16 1520 6 milli-units/min at 01/21/16 1520  . penicillin G potassium 2.5 Million Units in dextrose 5 % 100 mL IVPB  2.5 Million Units Intravenous Q4H Kathrynn Running, MD   2.5 Million Units at 01/22/16 [phone]  . penicillin G potassium 5 Million Units in dextrose 5 % 250 mL IVPB  5 Million Units Intravenous Once Federico Flake, MD       Followed by  . penicillin G potassium 2.5 Million Units in dextrose 5 % 100 mL IVPB  2.5 Million Units Intravenous Q4H Federico Flake, MD      . sodium citrate-citric acid (ORACIT) solution 30 mL  30 mL Oral Q2H PRN Kathrynn Running, MD      . terbutaline (BRETHINE) injection 0.25 mg  0.25 mg Subcutaneous Once PRN Isa Rankin  Alvester MorinNewton, MD        Assessment & Plan:  Pt stable *IUP: category I with accels *IOL: continue with FB until 24hrs s/p insertion at 2200 tonight; 60mL in place. Continue with low dose pitocin at 6 and will leave at that rate while FB in place *TOLAC: pt states she had 2011 term c-section in [place] for pre-x. She states the baby wasn't breech and she wasn't in labor and that they never told her that she couldn't labor in the future. That child was 7lbs. R/b/a d/w pt and 1-2% risk of uterine rupture and maternal/fetal morbidity associated with it. Pt also told long IOL process possible given her functional nullip status and that if she ever changes her mind and wants a c-section to just let us know. Pt and partner voice understanding. Leopolds cephalic, 3500gm *GBS pos: s/p PCN x 2 already *Analgesia: recommended early epidural given TOLAC and elevated BMI *h/o nephrectomy: s/p normal CMP this pregnancy.  Spanish interpreter used  Cornelia Copaharlie Liahna Brickner, Jr. MD Attending Center for [facility](Faculty Practice)

## 2016-01-22 NOTE — Progress Notes (Signed)
Labor Progress Note Natalie PortsKenia Armstrong is a 25 y.o. G2P1001 at 6540w2d presented for IOL for postdates in the setting of prior CS.  S: Patient has no complaints at this time. She has not required any pain medications.  O:  BP 116/79 mmHg  Pulse 111  Temp(Src) 98.8 F (37.1 C) (Oral)  Resp 20  Ht 4\' 11"  (1.499 m)  Wt 99.338 kg (219 lb)  BMI 44.21 kg/m2 EFM: 150/moderate variability/accelerations 15x15/no decelerations  CVE: Dilation: Fingertip Effacement (%): Thick Cervical Position: Middle Station: Ballotable Presentation: Vertex Exam by:: Ivonne AndrewV Smith, CNM   A&P: 25 y.o. G2P1001 10840w2d for IOL for PD. TOLAC.  #Labor: foley bulb placed around 2230. #Pain: PRN medications. #FWB: Cat I. #GBS positive. Will start PCN.  Vanice SarahPeter Riaan [place], medical student

## 2016-01-22 NOTE — Progress Notes (Signed)
I assisted WUJWJXBWalidah CNM, Rodena PietyFRAN Cresenzo CNM, with explanation of care, by Orlan LeavensViria Alvarez Spanish Interpreter., ordered pt food.

## 2016-01-22 NOTE — Progress Notes (Signed)
   Natalie Armstrong is a 25 y.o. G2P1001 at 4369w2d  admitted for induction of labor due to Post dates.  Subjective: 6/10 pain w/contractions.  Declines meds.    Objective: Filed Vitals:   01/22/16 1801 01/22/16 1831 01/22/16 1901 01/22/16 1930  BP: 120/78 115/75 130/81   Pulse: 114 123 106   Temp:  98.3 F (36.8 C)  99.1 F (37.3 C)  TempSrc:  Oral  Oral  Resp: 20   18  Height:      Weight:          FHT:  FHR: 150 bpm, variability: moderate,  accelerations:  Present,  decelerations:  Absent UC:   irregular, every 2-4 minutes SVE:   Dilation: 7 Effacement (%): 50 Station: -3 Exam by:: Natalie Armstrong, CNM  AROM at 1800, clear fluid.  Cx still minimal change.  Pitocin @ 6 mu/min  Labs: Lab Results  Component Value Date   WBC 10.1 01/21/2016   HGB 11.8* 01/21/2016   HCT 34.8* 01/21/2016   MCV 77.0* 01/21/2016   PLT 216 01/21/2016    Assessment / Plan: IOL for postdates, not in labor.  IUPC placed.  Will increase pitocin per protocol until adequate.   Labor: no Fetal Wellbeing:  Category I Pain Control:  Labor support without medications Anticipated MOD:  NSVD  CRESENZO-DISHMAN,Seniya Stoffers 01/22/2016, 8:35 PM

## 2016-01-22 NOTE — Progress Notes (Signed)
   Subjective: Pt report pain increase in pain.  Declines epidural at this time.  Declines AROM.  Objective: BP 118/80 mmHg  Pulse 108  Temp(Src) 98.5 F (36.9 C) (Oral)  Resp 30  Ht 4\' 11"  (1.499 m)  Wt 219 lb (99.338 kg)  BMI 44.21 kg/m2      FHT:  FHR: 150's bpm, variability: moderate,  accelerations:  Present,  decelerations:  Absent UC:   3-6 SVE:   Dilation: 7 Effacement (%): 50 Station: -3 Exam by:: Margarita MailW. Karim, CNM  Labs: Lab Results  Component Value Date   WBC 10.1 01/21/2016   HGB 11.8* 01/21/2016   HCT 34.8* 01/21/2016   MCV 77.0* 01/21/2016   PLT 216 01/21/2016    Assessment / Plan: Spontaneous labor, progressing normally  Labor: Progressing normally.  Consider AROM if minimal cervical change. Preeclampsia:  n/a Fetal Wellbeing:  Category I Pain Control:  Labor support without medications I/D:  n/a Anticipated MOD:  NSVD  Marlis EdelsonKARIM, Alece Koppel N 01/22/2016, 4:38 PM

## 2016-01-23 ENCOUNTER — Encounter (HOSPITAL_COMMUNITY)
Admission: RE | Disposition: A | Discharge status: Home / Self Care | Payer: Self-pay | Source: Ambulatory Visit | Attending: Obstetrics & Gynecology

## 2016-01-23 ENCOUNTER — Encounter (HOSPITAL_COMMUNITY): Payer: Self-pay

## 2016-01-23 DIAGNOSIS — O9902 Anemia complicating childbirth: Secondary | ICD-10-CM

## 2016-01-23 DIAGNOSIS — D649 Anemia, unspecified: Secondary | ICD-10-CM

## 2016-01-23 DIAGNOSIS — Z3A41 41 weeks gestation of pregnancy: Secondary | ICD-10-CM

## 2016-01-23 DIAGNOSIS — O480 Post-term pregnancy: Secondary | ICD-10-CM

## 2016-01-23 DIAGNOSIS — O34219 Maternal care for unspecified type scar from previous cesarean delivery: Secondary | ICD-10-CM

## 2016-01-23 SURGERY — Surgical Case

## 2016-01-23 MED ORDER — PHENYLEPHRINE 40 MCG/ML (10ML) SYRINGE FOR IV PUSH (FOR BLOOD PRESSURE SUPPORT): 80.0000 ug | PREFILLED_SYRINGE | INTRAVENOUS | Status: DC | PRN

## 2016-01-23 MED ORDER — DEXTROSE 5 % IV SOLN
500.0000 mg | Freq: Once | INTRAVENOUS | Status: AC
  Administered 2016-01-23: 500 mg via INTRAVENOUS
  Filled 2016-01-23: qty 500

## 2016-01-23 MED ORDER — OXYTOCIN 10 UNIT/ML IJ SOLN
INTRAMUSCULAR | Status: AC
  Filled 2016-01-23: qty 4

## 2016-01-23 MED ORDER — MENTHOL 3 MG MT LOZG
1.0000 | LOZENGE | OROMUCOSAL | Status: DC | PRN
  Filled 2016-01-23: qty 9

## 2016-01-23 MED ORDER — LACTATED RINGERS IV SOLN
INTRAVENOUS | Status: DC | PRN
  Administered 2016-01-23 (×2): via INTRAVENOUS

## 2016-01-23 MED ORDER — DEXAMETHASONE SODIUM PHOSPHATE 4 MG/ML IJ SOLN
INTRAMUSCULAR | Status: DC | PRN
  Administered 2016-01-23: 4 mg via INTRAVENOUS

## 2016-01-23 MED ORDER — TETANUS-DIPHTH-ACELL PERTUSSIS 5-2.5-18.5 LF-MCG/0.5 IM SUSP
0.5000 mL | Freq: Once | INTRAMUSCULAR | Status: DC
  Filled 2016-01-23: qty 0.5

## 2016-01-23 MED ORDER — ACETAMINOPHEN 325 MG PO TABS
650.0000 mg | ORAL_TABLET | ORAL | Status: DC | PRN
  Administered 2016-01-25: 650 mg via ORAL

## 2016-01-23 MED ORDER — SCOPOLAMINE 1 MG/3DAYS TD PT72
MEDICATED_PATCH | TRANSDERMAL | Status: AC
  Filled 2016-01-23: qty 1

## 2016-01-23 MED ORDER — COCONUT OIL OIL
1.0000 | TOPICAL_OIL | Status: DC | PRN
Start: 2016-01-23 — End: 2016-01-26
  Filled 2016-01-23: qty 120

## 2016-01-23 MED ORDER — LACTATED RINGERS IV SOLN: 500.0000 mL | Freq: Once | INTRAVENOUS | Status: DC

## 2016-01-23 MED ORDER — LIDOCAINE HCL (PF) 1 % IJ SOLN
INTRAMUSCULAR | Status: DC | PRN
  Administered 2016-01-22: 2 mL
  Administered 2016-01-22: 3 mL
  Administered 2016-01-22: 5 mL

## 2016-01-23 MED ORDER — IBUPROFEN 600 MG PO TABS
600.0000 mg | ORAL_TABLET | Freq: Four times a day (QID) | ORAL | Status: DC
  Administered 2016-01-24 – 2016-01-26 (×10): 600 mg via ORAL
  Filled 2016-01-23 (×10): qty 1

## 2016-01-23 MED ORDER — PRENATAL MULTIVITAMIN CH
1.0000 | ORAL_TABLET | Freq: Every day | ORAL | Status: DC
  Administered 2016-01-24 – 2016-01-26 (×3): 1 via ORAL
  Filled 2016-01-23 (×4): qty 1

## 2016-01-23 MED ORDER — SODIUM CHLORIDE 0.9 % IV SOLN
10000.0000 ug | INTRAVENOUS | Status: DC | PRN
  Administered 2016-01-23: 80 ug via INTRAVENOUS
  Administered 2016-01-23: 40 ug via INTRAVENOUS
  Administered 2016-01-23 (×6): 80 ug via INTRAVENOUS

## 2016-01-23 MED ORDER — ZOLPIDEM TARTRATE 5 MG PO TABS: 5.0000 mg | ORAL_TABLET | Freq: Every evening | ORAL | Status: DC | PRN

## 2016-01-23 MED ORDER — SOD CITRATE-CITRIC ACID 500-334 MG/5ML PO SOLN
30.0000 mL | ORAL | Status: AC
  Administered 2016-01-23: 30 mL via ORAL

## 2016-01-23 MED ORDER — DIPHENHYDRAMINE HCL 25 MG PO CAPS
25.0000 mg | ORAL_CAPSULE | Freq: Four times a day (QID) | ORAL | Status: DC | PRN
  Filled 2016-01-23: qty 1

## 2016-01-23 MED ORDER — PHENYLEPHRINE 40 MCG/ML (10ML) SYRINGE FOR IV PUSH (FOR BLOOD PRESSURE SUPPORT)
PREFILLED_SYRINGE | INTRAVENOUS | Status: AC
  Filled 2016-01-23: qty 30

## 2016-01-23 MED ORDER — FENTANYL CITRATE (PF) 100 MCG/2ML IJ SOLN
INTRAMUSCULAR | Status: AC
  Filled 2016-01-23: qty 2

## 2016-01-23 MED ORDER — OXYTOCIN 10 UNIT/ML IJ SOLN
40.0000 [IU] | INTRAMUSCULAR | Status: DC | PRN
  Administered 2016-01-23: 40 [IU] via INTRAVENOUS

## 2016-01-23 MED ORDER — SIMETHICONE 80 MG PO CHEW
80.0000 mg | CHEWABLE_TABLET | ORAL | Status: DC | PRN
Start: 2016-01-23 — End: 2016-01-26
  Filled 2016-01-23: qty 1

## 2016-01-23 MED ORDER — SODIUM BICARBONATE 8.4 % IV SOLN
INTRAVENOUS | Status: DC | PRN
  Administered 2016-01-23 (×3): 5 mL via EPIDURAL

## 2016-01-23 MED ORDER — MORPHINE SULFATE (PF) 0.5 MG/ML IJ SOLN
INTRAMUSCULAR | Status: DC | PRN
  Administered 2016-01-23: 4 mg via EPIDURAL

## 2016-01-23 MED ORDER — DEXAMETHASONE SODIUM PHOSPHATE 4 MG/ML IJ SOLN
INTRAMUSCULAR | Status: AC
  Filled 2016-01-23: qty 1

## 2016-01-23 MED ORDER — LACTATED RINGERS IV SOLN
INTRAVENOUS | Status: DC
  Administered 2016-01-24 (×2): via INTRAVENOUS

## 2016-01-23 MED ORDER — ONDANSETRON HCL 4 MG/2ML IJ SOLN
INTRAMUSCULAR | Status: AC
  Filled 2016-01-23: qty 2

## 2016-01-23 MED ORDER — WITCH HAZEL-GLYCERIN EX PADS: 1.0000 "application " | MEDICATED_PAD | CUTANEOUS | Status: DC | PRN

## 2016-01-23 MED ORDER — SENNOSIDES-DOCUSATE SODIUM 8.6-50 MG PO TABS
2.0000 | ORAL_TABLET | ORAL | Status: DC
  Administered 2016-01-25 – 2016-01-26 (×2): 2 via ORAL
  Filled 2016-01-23 (×4): qty 2

## 2016-01-23 MED ORDER — MORPHINE SULFATE (PF) 0.5 MG/ML IJ SOLN
INTRAMUSCULAR | Status: AC
  Filled 2016-01-23: qty 10

## 2016-01-23 MED ORDER — ACETAMINOPHEN 10 MG/ML IV SOLN
1000.0000 mg | Freq: Once | INTRAVENOUS | Status: AC
  Administered 2016-01-23: 1000 mg via INTRAVENOUS
  Filled 2016-01-23: qty 100

## 2016-01-23 MED ORDER — OXYCODONE HCL 5 MG PO TABS
5.0000 mg | ORAL_TABLET | ORAL | Status: DC | PRN
  Administered 2016-01-25: 5 mg via ORAL
  Filled 2016-01-23 (×2): qty 1

## 2016-01-23 MED ORDER — PROMETHAZINE HCL 25 MG/ML IJ SOLN
12.5000 mg | Freq: Once | INTRAMUSCULAR | Status: AC
  Administered 2016-01-23: 12.5 mg via INTRAVENOUS
  Filled 2016-01-23: qty 1

## 2016-01-23 MED ORDER — OXYCODONE HCL 5 MG PO TABS: 10.0000 mg | ORAL_TABLET | ORAL | Status: DC | PRN

## 2016-01-23 MED ORDER — ONDANSETRON HCL 4 MG/2ML IJ SOLN
INTRAMUSCULAR | Status: DC | PRN
  Administered 2016-01-23: 4 mg via INTRAVENOUS

## 2016-01-23 MED ORDER — CEFAZOLIN SODIUM-DEXTROSE 2-4 GM/100ML-% IV SOLN
2.0000 g | INTRAVENOUS | Status: AC
  Administered 2016-01-23: 2 g via INTRAVENOUS

## 2016-01-23 MED ORDER — DIBUCAINE 1 % RE OINT
1.0000 "application " | TOPICAL_OINTMENT | RECTAL | Status: DC | PRN
  Filled 2016-01-23: qty 56.7

## 2016-01-23 MED ORDER — FENTANYL CITRATE (PF) 100 MCG/2ML IJ SOLN
INTRAMUSCULAR | Status: DC | PRN
  Administered 2016-01-23: 100 ug via EPIDURAL

## 2016-01-23 MED ORDER — SIMETHICONE 80 MG PO CHEW
80.0000 mg | CHEWABLE_TABLET | ORAL | Status: DC
  Administered 2016-01-24 – 2016-01-26 (×3): 80 mg via ORAL
  Filled 2016-01-23 (×5): qty 1

## 2016-01-23 MED ORDER — KETOROLAC TROMETHAMINE 30 MG/ML IJ SOLN
INTRAMUSCULAR | Status: AC
  Administered 2016-01-23: 30 mg via INTRAMUSCULAR
  Filled 2016-01-23: qty 1

## 2016-01-23 MED ORDER — EPHEDRINE 5 MG/ML INJ: 10.0000 mg | INTRAVENOUS | Status: DC | PRN

## 2016-01-23 MED ORDER — OXYTOCIN 40 UNITS IN LACTATED RINGERS INFUSION - SIMPLE MED: 2.5000 [IU]/h | INTRAVENOUS | Status: AC

## 2016-01-23 MED ORDER — ACETAMINOPHEN 325 MG PO TABS
650.0000 mg | ORAL_TABLET | Freq: Four times a day (QID) | ORAL | Status: DC | PRN
  Filled 2016-01-23: qty 2

## 2016-01-23 MED ORDER — SIMETHICONE 80 MG PO CHEW
80.0000 mg | CHEWABLE_TABLET | Freq: Three times a day (TID) | ORAL | Status: DC
  Administered 2016-01-24 – 2016-01-26 (×7): 80 mg via ORAL
  Filled 2016-01-23 (×11): qty 1

## 2016-01-23 MED ORDER — ACETAMINOPHEN 10 MG/ML IV SOLN: 1000.0000 mg | Freq: Four times a day (QID) | INTRAVENOUS | Status: DC

## 2016-01-23 SURGICAL SUPPLY — 35 items
BENZOIN TINCTURE PRP APPL 2/3 (GAUZE/BANDAGES/DRESSINGS) ×2 IMPLANT
CANISTER SUCT 3000ML PPV (MISCELLANEOUS) ×2 IMPLANT
CHLORAPREP W/TINT 26ML (MISCELLANEOUS) ×2 IMPLANT
CLOSURE STERI STRIP 1/2 X4 (GAUZE/BANDAGES/DRESSINGS) ×2 IMPLANT
DRSG OPSITE POSTOP 4X10 (GAUZE/BANDAGES/DRESSINGS) ×2 IMPLANT
DRSG TELFA 3X8 NADH (GAUZE/BANDAGES/DRESSINGS) IMPLANT
ELECT CAUTERY BLADE 6.4 (BLADE) ×2 IMPLANT
ELECT REM PT RETURN 9FT ADLT (ELECTROSURGICAL) ×2
ELECTRODE REM PT RTRN 9FT ADLT (ELECTROSURGICAL) ×1 IMPLANT
GLOVE BIOGEL PI IND STRL 7.0 (GLOVE) ×2 IMPLANT
GLOVE BIOGEL PI IND STRL 7.5 (GLOVE) ×1 IMPLANT
GLOVE BIOGEL PI INDICATOR 7.0 (GLOVE) ×2
GLOVE BIOGEL PI INDICATOR 7.5 (GLOVE) ×1
GLOVE SURG SS PI 7.0 STRL IVOR (GLOVE) ×2 IMPLANT
GOWN STRL REUS W/ TWL LRG LVL3 (GOWN DISPOSABLE) ×2 IMPLANT
GOWN STRL REUS W/ TWL XL LVL3 (GOWN DISPOSABLE) ×1 IMPLANT
GOWN STRL REUS W/TWL LRG LVL3 (GOWN DISPOSABLE) ×2
GOWN STRL REUS W/TWL XL LVL3 (GOWN DISPOSABLE) ×1
LIQUID BAND (GAUZE/BANDAGES/DRESSINGS) ×2 IMPLANT
NS IRRIG 1000ML POUR BTL (IV SOLUTION) ×2 IMPLANT
PACK C SECTION WH (CUSTOM PROCEDURE TRAY) ×2 IMPLANT
PAD ABD 8X7 1/2 STERILE (GAUZE/BANDAGES/DRESSINGS) ×2 IMPLANT
PAD OB MATERNITY 4.3X12.25 (PERSONAL CARE ITEMS) ×2 IMPLANT
PAD PREP 24X48 CUFFED NSTRL (MISCELLANEOUS) ×2 IMPLANT
RTRCTR C-SECT PINK 25CM LRG (MISCELLANEOUS) ×2 IMPLANT
SPONGE GAUZE 4X4 12PLY STER LF (GAUZE/BANDAGES/DRESSINGS) ×4 IMPLANT
STRIP CLOSURE SKIN 1/2X4 (GAUZE/BANDAGES/DRESSINGS) ×2 IMPLANT
SUT MON AB 4-0 PS1 27 (SUTURE) ×2 IMPLANT
SUT MON AB-0 CT1 36 (SUTURE) ×6 IMPLANT
SUT PLAIN 2 0 (SUTURE) ×1
SUT PLAIN ABS 2-0 CT1 27XMFL (SUTURE) ×1 IMPLANT
SUT VIC AB 0 CT1 36 (SUTURE) ×4 IMPLANT
SUT VIC AB 3-0 CT1 27 (SUTURE) ×1
SUT VIC AB 3-0 CT1 TAPERPNT 27 (SUTURE) ×1 IMPLANT
SUT VIC AB 4-0 KS 27 (SUTURE) ×2 IMPLANT

## 2016-01-23 NOTE — Progress Notes (Signed)
   Natalie PortsKenia Armstrong is a 25 y.o. G2P1001 at 10624w3d  admitted for induction of labor due to Post dates. .  Subjective: Requests epidural  Objective: Filed Vitals:   01/23/16 0301 01/23/16 0331 01/23/16 0401 01/23/16 0431  BP: 117/58 109/66 120/67 123/67  Pulse: 118 125 122 121  Temp:      TempSrc:      Resp:      Height:      Weight:      SpO2:          FHT:  FHR: 150 bpm, variability: moderate,  accelerations:  Present,  decelerations:  Absent UC:   MVUs ` 120 SVE:   Dilation: 7 Effacement (%): 60 Station: -2 Exam by:: Drenda FreezeFran, CNM Pitocin @ 20 mu/min  Labs: Lab Results  Component Value Date   WBC 10.1 01/21/2016   HGB 11.8* 01/21/2016   HCT 34.8* 01/21/2016   MCV 77.0* 01/21/2016   PLT 216 01/21/2016    Assessment / Plan: IOL, not in labor Increase pitocin until adequate labor; may need pit break Labor: inadequate Fetal Wellbeing:  Category I Pain Control:  Epidural Anticipated MOD:  NSVD  CRESENZO-DISHMAN,Natalie Armstrong 01/23/2016, 4:58 AM

## 2016-01-23 NOTE — Progress Notes (Signed)
Dr. Vergie LivingPickens at bedside discussing recommendations of c-section, interpretor at bedside

## 2016-01-23 NOTE — Progress Notes (Signed)
I was present during C-Section by Orlan LeavensViria Alvarez Spanish Interpreter.

## 2016-01-23 NOTE — Progress Notes (Signed)
L&D Note With interpreter  Pt comfortable Category I with accels, q3-2119m UCs on 24 of pitocin but still not adequate Temp just now 98 5/50/presenting part -1 with BPD OOP  Recommend proceeding with c-section for failed IOL with arrest of dilation. Risk of continuing with IOL is that her cx isn't changing and she has increased risk for infection and bleeding. Pt and partner would like 20-734m to think about it which is fine since fetus is reassuring.  Cornelia Copaharlie Geroge Gilliam, Jr MD Attending Center for [facility](Faculty Practice)

## 2016-01-23 NOTE — Progress Notes (Addendum)
L&D Note  With interpreter  Pt and husband would like to proceed with c-section.  D/c pitocin. Fetus category I and will let anesthesia know about arrest of dilation c-section (non urgent) given reassuring maternal fetal status. Will do ancef for ppx and since has been rupture will do azithromycin 500mg  IV x 1 along with ancef. Pt has vertical skin incision. I d/w her that I recommend low transverse given her body habitus to decrease risk of wound separation infection, less post op pain, etc and she is amenable to this.  Can proceed when all parties are ready  Cornelia Copaharlie Shahzain Kiester, Jr MD Attending Center for [facility](Faculty Practice)

## 2016-01-23 NOTE — Progress Notes (Signed)
Dr. Vergie LivingPickens at bedside explaining risks and benefits of c-section. Pt verbalizes understanding and consents. FOB at bedside. Interpretor at bedside.

## 2016-01-23 NOTE — Anesthesia Rounding Note (Signed)
  CRNA Epidural Rounding Note  Patient: Corinne PortsKenia Martinez, 25 y.o., female  Patient's current pain level: Pain Score: 10-Worst pain ever (01/22/16 2337)  Agreed upon pain management level: Patient sleeping - unable to assess  Epidural intervention: Unable to assess - patient sleeping  Comments:   [facility] VirginiaEIGHT,Janee Ureste 01/23/2016

## 2016-01-23 NOTE — Progress Notes (Signed)
   Natalie Armstrong is a 25 y.o. G2P1001 at 2152w3d  admitted for induction of labor due to Post dates. .  Subjective: Requests epidural  Objective: Filed Vitals:   01/22/16 1831 01/22/16 1901 01/22/16 1930 01/22/16 2337  BP: 115/75 130/81    Pulse: 123 106    Temp: 98.3 F (36.8 C)  99.1 F (37.3 C)   TempSrc: Oral  Oral   Resp:   18 18  Height:      Weight:          FHT:  FHR: 150 bpm, variability: moderate,  accelerations:  Present,  decelerations:  Absent UC:   MVUs ` 150 SVE:   Dilation: 7 Effacement (%): 50 Station: -2 Exam by:: Natalie Armstrong CNM Pitocin @ 14 mu/min  Labs: Lab Results  Component Value Date   WBC 10.1 01/21/2016   HGB 11.8* 01/21/2016   HCT 34.8* 01/21/2016   MCV 77.0* 01/21/2016   PLT 216 01/21/2016    Assessment / Plan: IOL, not in labor Increase pitocin until adequate labor Labor: inadequate Fetal Wellbeing:  Category I Pain Control:  Epidural Anticipated MOD:  NSVD  Natalie Armstrong 01/23/2016, 12:01 AM

## 2016-01-23 NOTE — Anesthesia Procedure Notes (Signed)
Epidural Patient location during procedure: OB  Staffing Anesthesiologist: Karsten Vaughn Performed by: anesthesiologist   Preanesthetic Checklist Completed: patient identified, site marked, surgical consent, pre-op evaluation, timeout performed, IV checked, risks and benefits discussed and monitors and equipment checked  Epidural Patient position: sitting Prep: site prepped and draped and DuraPrep Patient monitoring: continuous pulse ox and blood pressure Approach: midline Location: L4-L5 Injection technique: LOR saline  Needle:  Needle type: Tuohy  Needle gauge: 17 G Needle length: 9 cm and 9 Needle insertion depth: 9 cm Catheter type: closed end flexible Catheter size: 19 Gauge Catheter at skin depth: 17 cm Test dose: negative  Assessment Events: blood not aspirated, injection not painful, no injection resistance, negative IV test and no paresthesia   

## 2016-01-23 NOTE — Progress Notes (Signed)
L&D Note  01/23/2016 - 10:11 AM  25 y.o. G2P1001 [redacted]w[redacted]d (EDC 5/23). Pregnancy complicated by h/o term c-section, BMI 44, GBS pos, h/o pre-x, h/o nephrectomy  [name] is admitted for IOL for TOLAC   Subjective:  Patient states UCs are painful but had one and was in NAD. Unsure of frequency.    Objective:   Filed Vitals:   01/23/16 0831 01/23/16 0901 01/23/16 0931 01/23/16 1001  BP: 132/68 120/74 105/44 108/43  Pulse: 130 126 123 128  Temp:   99 F (37.2 C)   TempSrc:   Oral   Resp:   18   Height:      Weight:      SpO2:        Current Vital Signs 24h Vital Sign Ranges  T 99 F (37.2 C) Temp  Avg: 98.8 F (37.1 C)  Min: 98.3 F (36.8 C)  Max: 99.1 F (37.3 C)  BP (!) 108/43 mmHg BP  Min: 98/38  Max: 134/71  HR (!) 128 Pulse  Avg: 120.9  Min: 96  Max: 135  RR 18 Resp  Avg: 20.4  Min: 18  Max: 30  SaO2 94 % Not Delivered SpO2  Avg: 97.5 %  Min: 94 %  Max: 100 %       24 Hour I/O Current Shift I/O  Time Ins Outs       FHR: 155 baseline, +accels, no decels, mod var Toco: q37m Gen: NAD SVE: deferred. IUPC in place  Labs:  No new labs  Medications Current Facility-Administered Medications  Medication Dose Route Frequency Provider Last Rate Last Dose  . diphenhydrAMINE (BENADRYL) injection 12.5 mg  12.5 mg Intravenous Q15 min PRN Jairo Ben, MD      . ePHEDrine injection 10 mg  10 mg Intravenous PRN Jairo Ben, MD      . ePHEDrine injection 10 mg  10 mg Intravenous PRN Jairo Ben, MD      . ePHEDrine injection 10 mg  10 mg Intravenous PRN Marcene Duos, MD      . fentaNYL (SUBLIMAZE) injection 100 mcg  100 mcg Intravenous Q1H PRN Dorathy Kinsman, CNM   100 mcg at 01/22/16 0324  . fentaNYL 2.5 mcg/ml w/bupivacaine 0.1% in NS epidural infusion (WH-ANES)  14 mL/hr Epidural Continuous PRN Jairo Ben, MD 14 mL/hr at 01/23/16 0733 14 mL/hr at 01/23/16 0733  . lactated ringers infusion 500 mL  500 mL Intravenous Once Jairo Ben,  MD      . lactated ringers infusion 500 mL  500 mL Intravenous Once Marcene Duos, MD      . lactated ringers infusion 500-1,000 mL  500-1,000 mL Intravenous PRN Kathrynn Running, MD      . lactated ringers infusion   Intravenous Continuous Kathrynn Running, MD 125 mL/hr at 01/23/16 0111    . lidocaine (PF) (XYLOCAINE) 1 % injection 30 mL  30 mL Subcutaneous PRN Kathrynn Running, MD      . ondansetron [facility]) injection 4 mg  4 mg Intravenous Q6H PRN Kathrynn Running, MD   4 mg at 01/23/16 0111  . oxytocin (PITOCIN) IV BOLUS FROM BAG  500 mL Intravenous Continuous Kathrynn Running, MD      . oxytocin (PITOCIN) IV infusion 40 units in LR 1000 mL - Premix  2.5 Units/hr Intravenous Continuous Kathrynn Running, MD      . oxytocin (PITOCIN) IV infusion 40 units in LR 1000 mL - Premix  1-40 milli-units/min  Intravenous Titrated Jacklyn ShellFrances Cresenzo-Dishmon, CNM 24 mL/hr at 01/23/16 1000 16 milli-units/min at 01/23/16 1000  . penicillin G potassium 2.5 Million Units in dextrose 5 % 100 mL IVPB  2.5 Million Units Intravenous Q4H Kathrynn RunningNoah Bedford Wouk, MD   2.5 Million Units at 01/23/16 0935  . PHENYLephrine 40 mcg/ml in normal saline Adult IV Push Syringe  80 mcg Intravenous PRN Jairo Benarswell Jackson, MD      . PHENYLephrine 40 mcg/ml in normal saline Adult IV Push Syringe  80 mcg Intravenous PRN Jairo Benarswell Jackson, MD      . PHENYLephrine 40 mcg/ml in normal saline Adult IV Push Syringe  80 mcg Intravenous PRN Marcene Duosobert Fitzgerald, MD      . sodium citrate-citric acid (ORACIT) solution 30 mL  30 mL Oral Q2H PRN Kathrynn RunningNoah Bedford Wouk, MD      . terbutaline (BRETHINE) injection 0.25 mg  0.25 mg Subcutaneous Once PRN Federico FlakeKimberly Niles Newton, MD       Facility-Administered Medications Ordered in Other Encounters  Medication Dose Route Frequency Provider Last Rate Last Dose  . lidocaine (PF) (XYLOCAINE) 1 % injection    Anesthesia Intra-op Marcene Duosobert Fitzgerald, MD   3 mL at 01/22/16 2349    Assessment & Plan:  Pt  stable *IUP: category I with accels *IOL: pt has had an irregular labor course. She was admitted on 5/31 AM and got pitocin and then FB placed late on 5/31, with low dose pitocin going condordantly. This came out on its own in the late morning/early afternoon on 6/3 and she was approx 6/5/ballotable. She was then AROM'ed for clear fluid at 1815 and was 7cm with an IUPC placed. At this time too her pit was increased since she had been at a max of 6 and the pitocin was titrated up for effect. At 0550 a pit break was done and she was at 24mU at this time and she was restarted on pitocin at 0700 at 6mU.  She is currently at 16mU and have q3924m UCs with MVUs that are <100.  D/w patient and husband regarding long term plan. Pt told that if she has more pain/pressure to let us know for SVE but if not, will come by and check in the early afternoon and if cx is unchanged then will need to consider if VD is viable option given SROM for approx 18hrs at that point and pitocin on the entire and if unable to get her into an adequate pattern, with risk of IAI d/w her. Also, she's at increased risk of PPH given prolonged IOL.  *TOLAC: see note from yesterday for d/w pt and r/b/a *GBS pos: s/p PCN x 2 already *Analgesia: epidural in place and seems to be working well *h/o nephrectomy: s/p normal CMP this pregnancy.  Spanish interpreter used  Cornelia Copaharlie Georga Stys, Jr. MD Attending Center for [facility](Faculty Practice)

## 2016-01-23 NOTE — Anesthesia Preprocedure Evaluation (Signed)
Anesthesia Evaluation  Patient identified by MRN, date of birth, ID band Patient awake    Reviewed: Allergy & Precautions, Patient's Chart, lab work & pertinent test results  Airway Mallampati: III  TM Distance: >3 FB     Dental   Pulmonary neg pulmonary ROS,    Pulmonary exam normal        Cardiovascular negative cardio ROS Normal cardiovascular exam     Neuro/Psych negative neurological ROS     GI/Hepatic negative GI ROS, Neg liver ROS,   Endo/Other  Morbid obesity  Renal/GU Renal disease (s/p nephrectomy)     Musculoskeletal   Abdominal   Peds  Hematology negative hematology ROS (+)   Anesthesia Other Findings   Reproductive/Obstetrics (+) Pregnancy                             Lab Results  Component Value Date   WBC 10.1 01/21/2016   HGB 11.8* 01/21/2016   HCT 34.8* 01/21/2016   MCV 77.0* 01/21/2016   PLT 216 01/21/2016   Lab Results  Component Value Date   CREATININE 0.47 11/12/2015   BUN <5* 11/12/2015   NA 136 11/12/2015   K 3.5 11/12/2015   CL 106 11/12/2015   CO2 20* 11/12/2015    Anesthesia Physical Anesthesia Plan  ASA: III  Anesthesia Plan: Epidural   Post-op Pain Management:    Induction:   Airway Management Planned: Natural Airway  Additional Equipment:   Intra-op Plan:   Post-operative Plan:   Informed Consent: I have reviewed the patients History and Physical, chart, labs and discussed the procedure including the risks, benefits and alternatives for the proposed anesthesia with the patient or authorized representative who has indicated his/her understanding and acceptance.     Plan Discussed with:   Anesthesia Plan Comments:         Anesthesia Quick Evaluation

## 2016-01-23 NOTE — Progress Notes (Signed)
Per CNM cut pitocin off x2 hours for break.

## 2016-01-23 NOTE — Op Note (Signed)
Cesarean Section Operative Report  Natalie Armstrong  01/21/2016 - 01/23/2016  Indications: arrest of dilation, failed induction  Pre-operative Diagnosis: arrest of dilatation.   Post-operative Diagnosis: Same   Surgeon: Surgeon(s) and Role:    * [place] Bingharlie Pickens, MD - Primary    * Kathrynn RunningNoah Bedford Mellony Danziger, MD - Fellow   Attending Attestation: I was present and scrubbed for the entire procedure.   Assistants: none  Anesthesia: epidural    Estimated Blood Loss: 800 ml  Total IV Fluids: 2200 ml LR  Urine Output:: 150 ml clear yellow urine  Specimens: none  Findings: Viable female infant in cephalic presentation; Apgars 6/969/10; weight 3950 g; arterial cord pH not obtained; turbid amniotic fluid; intact placenta with three vessel cord; normal uterus, fallopian tubes and ovaries bilaterally. No significant adhesive disease.  Baby condition / location:  Couplet care / Skin to Skin   Complications: no complications  Indications: Natalie Armstrong is a 25 y.o. G2P2001 with an IUP 7650w3d presenting for PDIOL. Failed induction, arrest of dilation - please refer to progress note for detailed discussion of indications.  The risks, benefits, complications, treatment options, and expected outcomes were discussed with the patient . The patient concurred with the proposed plan, giving informed consent. identified as Natalie Armstrong and the procedure verified as C-Section Delivery.  Procedure Details:  The patient was taken back to the operative suite where epidural anesthesia was dosed.  A time out was held and the above information confirmed.   After induction of anesthesia, the patient was draped and prepped in the usual sterile manner and placed in a dorsal supine position with a leftward tilt. A Pfannenstiel incision was made and carried down through the subcutaneous tissue to the fascia. Fascial incision was made and sharply extended transversely. The fascia was separated from the underlying rectus  tissue superiorly and inferiorly. The peritoneum was identified and bluntly entered and extended longitudinally. Alexis retractor was placed. Bladder flap was created. A low transverse uterine incision was made and extended bluntly. Delivered from cephalic presentation was a viable infant with Apgars and weight as above. The umbilical cord was clamped and cut cord blood was obtained for evaluation. Cord ph was not sent. The placenta was removed Intact and appeared normal. . The uterine incision was closed with running locked sutures of 0 monocryl with an imbricating layer of the same.   Hemostasis was observed. The peritoneum was closed with 3-0 vicryl. The rectus muscles were examined and hemostasis observed. The fascia was then reapproximated with running sutures of 0Vicryl.  The subcuticular closure was performed using 2-0plain gut. The skin was closed with 4-0Vicryl.   Instrument, sponge, and needle counts were correct prior the abdominal closure and were correct at the conclusion of the case.     Disposition: PACU - hemodynamically stable.   Maternal Condition: stable       Signed: Lavonne Chickoah B WoukMD 01/23/2016 5:35 PM

## 2016-01-23 NOTE — Transfer of Care (Signed)
Immediate Anesthesia Transfer of Care Note  Patient: Corinne PortsKenia Martinez  Procedure(s) Performed: Procedure(s): CESAREAN SECTION (N/A)  Patient Location: PACU  Anesthesia Type:Epidural  Level of Consciousness: awake, alert  and oriented  Airway & Oxygen Therapy: Patient Spontanous Breathing  Post-op Assessment: Report given to RN and Post -op Vital signs reviewed and stable  Post vital signs: Reviewed and stable  Last Vitals:  Filed Vitals:   01/23/16 1431 01/23/16 1642  BP: 111/58 116/98  Pulse: 132 123  Temp:  36.6 C  Resp:  21    Last Pain:  Filed Vitals:   01/23/16 1642  PainSc: 0-No pain      Patients Stated Pain Goal: 8 (01/22/16 0604)  Complications: No apparent anesthesia complications

## 2016-01-24 ENCOUNTER — Encounter (HOSPITAL_COMMUNITY): Payer: Self-pay | Admitting: Obstetrics and Gynecology

## 2016-01-24 LAB — CBC
HCT: 26.9 % — ABNORMAL LOW (ref 36.0–46.0)
HEMOGLOBIN: 8.7 g/dL — AB (ref 12.0–15.0)
MCH: 25.3 pg — AB (ref 26.0–34.0)
MCHC: 32.3 g/dL (ref 30.0–36.0)
MCV: 78.2 fL (ref 78.0–100.0)
Platelets: 186 10*3/uL (ref 150–400)
RBC: 3.44 MIL/uL — AB (ref 3.87–5.11)
RDW: 15.5 % (ref 11.5–15.5)
WBC: 17.1 10*3/uL — ABNORMAL HIGH (ref 4.0–10.5)

## 2016-01-24 NOTE — Anesthesia Postprocedure Evaluation (Signed)
Anesthesia Post Note  Patient: Natalie Armstrong  Procedure(s) Performed: Procedure(s) (LRB): CESAREAN SECTION (N/A)  Patient location during evaluation: Mother Baby Anesthesia Type: Epidural Level of consciousness: awake Pain management: pain level controlled Vital Signs Assessment: post-procedure vital signs reviewed and stable Respiratory status: spontaneous breathing Cardiovascular status: stable Postop Assessment: no headache, no backache, epidural receding, patient able to bend at knees, no signs of nausea or vomiting and adequate PO intake Anesthetic complications: no     Last Vitals:  Filed Vitals:   01/24/16 0107 01/24/16 0611  BP:  114/57  Pulse:  96  Temp: 37 C 37.4 C  Resp: 18 20    Last Pain:  Filed Vitals:   01/24/16 0624  PainSc: 0-No pain   Pain Goal: Patients Stated Pain Goal: 8 (01/22/16 0604)               Fanny DanceMULLINS,[name]

## 2016-01-24 NOTE — Lactation Note (Signed)
This note was copied from a baby's chart. Lactation Consultation Note  Patient Name: [name] [id number]'s Date: 01/24/2016 Reason for consult: Initial assessment Ipad interpreter used. Baby at 27 hr of life. Mom reports baby is getting better with latching. She is offering the breast every 3 hr unless baby wants to eat before. She denies breast or nipple pain, voiced no concerns. Discussed baby behavior, feeding frequency, baby belly size, voids, wt loss, breast changes, and nipple care. She stated she can manually express, has seen colostrum, and has a spoon in the room. Given lactation handouts. Aware of OP services and support group.    Maternal Data Has patient been taught Hand Expression?: Yes  Feeding Feeding Type: Breast Fed Length of feed: 5 min  LATCH Score/Interventions Latch: Repeated attempts needed to sustain latch, nipple held in mouth throughout feeding, stimulation needed to elicit sucking reflex.  Audible Swallowing: None  Type of Nipple: Everted at rest and after stimulation  Comfort (Breast/Nipple): Soft / non-tender     Hold (Positioning): Assistance needed to correctly position infant at breast and maintain latch.  LATCH Score: 6  Lactation Tools Discussed/Used WIC Program: Yes   Consult Status Consult Status: Follow-up Date: 01/25/16 Follow-up type: In-patient    Rulon Eisenmengerlizabeth E Seng Larch 01/24/2016, 6:49 PM

## 2016-01-24 NOTE — Progress Notes (Signed)
Post Partum Day 1, POD1 Subjective:  Natalie Armstrong is a 25 y.o. G2P2001 3766w3d s/p Armstrong.  No acute events overnight. Pt denies nausea, vomiting, dizziness, or shortness of breath. She has not ambulated yet, and foley catheter is still in. Pain is well controlled.  She has had flatus.  Lochia Minimal. Pt is considering Nexplanon for birth control but has not yet decided. Method of Feeding: breast and bottle.  Objective: Blood pressure 114/57, pulse 96, temperature 99.3 F (37.4 C), temperature source Oral, resp. rate 20, height 4\' 11"  (1.499 m), weight 99.338 kg (219 lb), SpO2 98 %, unknown if currently breastfeeding.  Physical Exam:  General: alert, cooperative and no distress Chest: normal WOB Heart: Regular rate Abdomen: +BS, soft DVT Evaluation: no evidence of DVT seen on physical exam. Extremities: no edema   Recent Labs  01/21/16 1130 01/24/16 0600  HGB 11.8* 8.7*  HCT 34.8* 26.9*    Assessment/Plan:  ASSESSMENT: Natalie Armstrong is a 25 y.o. G2P2001 666w3d s/p Armstrong. Hemoglobin decreased from 11.8 to 8.7 but patient is asymptomatic.   Plan for discharge tomorrow Continue routine PP care Breastfeeding support PRN  CNM attestation Post Partum Day #1   Natalie Armstrong is a 25 y.o. G2P2001 s/p Armstrong due to failed TOLAC .  Pt denies problems with ambulating, voiding or po intake. Pain is well controlled.  Plan for birth control is considering Nexplanon.  Method of Feeding: both  PE:  BP 114/57 mmHg  Pulse 96  Temp(Src) 99.3 F (37.4 C) (Oral)  Resp 20  Ht 4\' 11"  (1.499 m)  Wt 99.338 kg (219 lb)  BMI 44.21 kg/m2  SpO2 98%  Breastfeeding? Unknown Fundus firm Inc: pressure dressing intact, dry  Plan for discharge: 01/25/16  Cam HaiSHAW, Georgetta Crafton, CNM 9:16 AM  01/24/2016

## 2016-01-25 ENCOUNTER — Encounter (HOSPITAL_COMMUNITY): Payer: Self-pay

## 2016-01-25 DIAGNOSIS — Z98891 History of uterine scar from previous surgery: Secondary | ICD-10-CM

## 2016-01-25 MED ORDER — IBUPROFEN 600 MG PO TABS: 600.0000 mg | ORAL_TABLET | Freq: Four times a day (QID) | ORAL | Status: DC

## 2016-01-25 MED ORDER — OXYCODONE-ACETAMINOPHEN 5-325 MG PO TABS: 1.0000 | ORAL_TABLET | Freq: Four times a day (QID) | ORAL | Status: DC | PRN

## 2016-01-25 NOTE — Progress Notes (Signed)
Checked on patients needs and ordered patients meals.  Spanish interpreter

## 2016-01-25 NOTE — Anesthesia Postprocedure Evaluation (Signed)
Anesthesia Post Note  Patient: Natalie Armstrong  Procedure(s) Performed: Procedure(s) (LRB): CESAREAN SECTION (N/A)  Patient location during evaluation: Mother Baby Anesthesia Type: Epidural Level of consciousness: awake and alert Pain management: pain level controlled Vital Signs Assessment: post-procedure vital signs reviewed and stable Respiratory status: spontaneous breathing, nonlabored ventilation and respiratory function stable Cardiovascular status: stable Postop Assessment: no headache, no backache and epidural receding Anesthetic complications: no     Last Vitals:  Filed Vitals:   01/24/16 1700 01/25/16 0608  BP: 101/56 98/60  Pulse: 105 87  Temp: 36.7 C 36.5 C  Resp: 20 20    Last Pain:  Filed Vitals:   01/25/16 0631  PainSc: 0-No pain   Pain Goal: Patients Stated Pain Goal: 8 (01/22/16 0604)               Junious SilkGILBERT,Donyae Kilner

## 2016-01-25 NOTE — Discharge Instructions (Signed)

## 2016-01-25 NOTE — Progress Notes (Signed)
Checked on patients needs.  °Spanish Interpreter  °

## 2016-01-25 NOTE — Lactation Note (Signed)
This note was copied from a baby's chart. Lactation Consultation Note: Trinna Postlex spanish interpreter present for my visit  Experienced BF mom has baby latched to the breast when I went into room. Baby is sliding down to the tip of nipple and snacking at the breast. Doing some tongue sucking. Baby moved to football hold and assisted mom with her hand placement. She is holding breast away from baby's nose. Assisted mom with deeper latch and mom reports she can feel the difference. Baby continues to do some smacking at the breast. Encouraged to keep diary of feedings and diaper changes. Dr.Brown notified of feeding assessment. No questions at present. To call for assist prn  Patient Name: Natalie Armstrong: 01/25/2016 Reason for consult: Follow-up assessment   Maternal Data Formula Feeding for Exclusion: No Has patient been taught Hand Expression?: Yes Does the patient have breastfeeding experience prior to this delivery?: Yes  Feeding Feeding Type: Breast Fed Length of feed: 10 min  LATCH Score/Interventions Latch: Repeated attempts needed to sustain latch, nipple held in mouth throughout feeding, stimulation needed to elicit sucking reflex.  Audible Swallowing: A few with stimulation  Type of Nipple: Everted at rest and after stimulation  Comfort (Breast/Nipple): Soft / non-tender     Hold (Positioning): Assistance needed to correctly position infant at breast and maintain latch. Intervention(s): Breastfeeding basics reviewed  LATCH Score: 7  Lactation Tools Discussed/Used     Consult Status Consult Status: Follow-up    Pamelia HoitWeeks, Devani Odonnel D 01/25/2016, 2:04 PM

## 2016-01-25 NOTE — Addendum Note (Signed)
Addendum  created 01/25/16 [id number] by Junious SilkMelinda La Dibella, CRNA   Modules edited: Charges VN, Clinical Notes   Clinical Notes:  File: [id number]

## 2016-01-25 NOTE — Progress Notes (Signed)
Checked on patients needs and ordered patients meals.  °Spanish Interpreter  °

## 2016-01-25 NOTE — Discharge Summary (Signed)
OB Discharge Summary     Patient Name: Natalie PortsKenia Armstrong DOB: 26-Dec-1990 MRN: [id number]  Date of admission: 01/21/2016 Delivering MD: [place] BingPICKENS, CHARLIE   Date of discharge: 01/25/2016  Admitting diagnosis: INDUCTION Intrauterine pregnancy: 237w3d     Secondary diagnosis:  Active Problems:   Post term pregnancy, 41 weeks   History of nephrectomy, unilateral   Anemia affecting pregnancy   H/O: C-section   S/P cesarean section  Additional problems: none     Discharge diagnosis: Term Pregnancy Delivered                                                                                                Post partum procedures:none  Augmentation: none  Complications: None  Hospital course:  Sceduled C/S   25 y.o. yo G2P2001 at 537w3d was admitted to the hospital 01/21/2016 for scheduled cesarean section with the following indication:Elective Repeat.  Membrane Rupture Time/Date: 6:13 PM ,01/22/2016   Patient delivered a Viable infant.01/23/2016  Details of operation can be found in separate operative note.  Pateint had an uncomplicated postpartum course.  She is ambulating, tolerating a regular diet, passing flatus, and urinating well. Patient is discharged home in stable condition on  01/25/2016          Physical exam  Filed Vitals:   01/24/16 1000 01/24/16 1324 01/24/16 1700 01/25/16 0608  BP: 97/70 105/58 101/56 98/60  Pulse: 104 108 105 87  Temp: 98.7 F (37.1 C) 99.1 F (37.3 C) 98 F (36.7 C) 97.7 F (36.5 C)  TempSrc: Oral Oral Oral Oral  Resp: 16 18 20 20   Height:      Weight:      SpO2: 100% 99%     General: alert, cooperative and no distress Lochia: appropriate Uterine Fundus: firm Incision: Dressing is clean, dry, and intact DVT Evaluation: No evidence of DVT seen on physical exam. Labs: Lab Results  Component Value Date   WBC 17.1* 01/24/2016   HGB 8.7* 01/24/2016   HCT 26.9* 01/24/2016   MCV 78.2 01/24/2016   PLT 186 01/24/2016   CMP Latest Ref Rng 11/12/2015   Glucose 65 - 99 mg/dL 98  BUN 6 - 20 mg/dL <4(U<5(L)  Creatinine 9.810.44 - 1.00 mg/dL 1.910.47  Sodium 478135 - 295145 mmol/L 136  Potassium 3.5 - 5.1 mmol/L 3.5  Chloride 101 - 111 mmol/L 106  CO2 22 - 32 mmol/L 20(L)  Calcium 8.9 - 10.3 mg/dL 8.2(L)  Total Protein 6.5 - 8.1 g/dL 6.1(L)  Total Bilirubin 0.3 - 1.2 mg/dL 0.4  Alkaline Phos 38 - 126 U/L 103  AST 15 - 41 U/L 17  ALT 14 - 54 U/L 13(L)    Discharge instruction: per After Visit Summary and "Baby and Me Booklet".  After visit meds:    Medication List    STOP taking these medications        aspirin 325 MG tablet      TAKE these medications        ibuprofen 600 MG tablet  Commonly known as:  ADVIL,MOTRIN  Take 1 tablet (600 mg total) by mouth every  6 (six) hours.     oxyCODONE-acetaminophen 5-325 MG tablet  Commonly known as:  ROXICET  Take 1-2 tablets by mouth every 6 (six) hours as needed for severe pain.     prenatal multivitamin Tabs tablet  Take 1 tablet by mouth daily at 12 noon.        Diet: routine diet  Activity: Advance as tolerated. Pelvic rest for 6 weeks.   Outpatient follow up:one week for wound check Follow up Appt:No future appointments. Follow up Visit:No Follow-up on file.  Postpartum contraception: Nexplanon  Newborn Data: Live born female  Birth Weight: 8 lb 11.3 oz (3950 g) APGAR: 9, 10  Baby Feeding: Bottle and Breast Disposition:home with mother   01/25/2016 Almon Hercules, MD   OB fellow attestation I have seen and examined this patient and agree with above documentation in the resident's note.   Natalie Armstrong is a 25 y.o. G2P2001 s/p rLTCS.   Pain is well controlled.  Plan for birth control is Nexplanon.  Method of Feeding: breast and formula  PE:  BP 98/60 mmHg  Pulse 87  Temp(Src) 97.7 F (36.5 C) (Oral)  Resp 20  Ht  (1.499 m)  Wt 219 lb (99.338 kg)  BMI 44.21 kg/m2  SpO2 99%  Breastfeeding? Unknown Gen: well appearing Heart: reg rate Lungs: normal WOB Fundus  firm Ext: soft, no pain, no edema   Recent Labs  01/24/16 0600  HGB 8.7*  HCT 26.9*   Plan: discharge today - postpartum care discussed - f/u clinic in 6 weeks for postpartum visit   Federico Flake, MD 10:13 AM

## 2016-01-26 MED ORDER — OXYCODONE HCL 5 MG PO TABS: 5.0000 mg | ORAL_TABLET | Freq: Three times a day (TID) | ORAL | Status: DC | PRN

## 2016-01-26 NOTE — Progress Notes (Signed)
UR chart review completed.  

## 2016-01-26 NOTE — Lactation Note (Signed)
This note was copied from a baby's chart. Lactation Consultation Note Experienced BF mom states BF going much better. Baby is cluster feeding, mom is breast/formula. Discussed formula feeding, gave feeding amount sheet. Encouraged breast first. Interpreter present for consult. Mom has WIC will f/u with them. Encouraged to cont. To document I&O, reviewed baby and me book for output guide and milk storage. Mom made aware of O/P services, breastfeeding support groups, community resources, and our phone # for post-discharge questions.  Patient Name: Natalie Armstrong FAOZH'YToday's Date: 01/26/2016 Reason for consult: Initial assessment   Maternal Data    Feeding Feeding Type: Formula Nipple Type: Slow - flow Length of feed: 15 min  LATCH Score/Interventions Latch: Repeated attempts needed to sustain latch, nipple held in mouth throughout feeding, stimulation needed to elicit sucking reflex. Intervention(s): Teach feeding cues;Skin to skin;Waking techniques Intervention(s): Breast massage;Assist with latch;Adjust position  Audible Swallowing: A few with stimulation Intervention(s): Hand expression;Skin to skin  Type of Nipple: Everted at rest and after stimulation  Comfort (Breast/Nipple): Soft / non-tender     Hold (Positioning): No assistance needed to correctly position infant at breast. Intervention(s): Position options;Skin to skin;Support Pillows;Breastfeeding basics reviewed  LATCH Score: 8  Lactation Tools Discussed/Used     Consult Status Consult Status: Complete Date: 01/26/16    Charyl DancerCARVER, Allesha Aronoff G 01/26/2016, 5:03 AM

## 2016-01-26 NOTE — Progress Notes (Signed)
POD 3 Subjective:  Natalie Armstrong is a 25 y.o. G2P2001 9245w3d s/p rLTCS after failed induction. She was discharged yesterday but had to stay for assistance with lactation.  No acute events overnight.  Pt denies problems with ambulating, voiding or po intake.  She denies nausea or vomiting.  Pain is well controlled.  She has had flatus.  Lochia Minimal.  Plan for birth control is Nexplanon.  Method of Feeding: breast and bottle  Objective: Blood pressure 113/67, pulse 82, temperature 97.9 F (36.6 C), temperature source Oral, resp. rate 19, height 4\' 11"  (1.499 m), weight 219 lb (99.338 kg), SpO2 99 %, unknown if currently breastfeeding.  Physical Exam:  General: alert, cooperative and no distress Lochia:normal flow Chest: normal WOB Heart: Regular rate Abdomen: +BS, soft, mild TTP (appropriate) Incision: honeycomb in place, dry and clean Uterine Fundus: firm DVT Evaluation: No evidence of DVT seen on physical exam. Extremities: no edema   Recent Labs  01/24/16 0600  HGB 8.7*  HCT 26.9*    Assessment/Plan:  ASSESSMENT: Natalie Armstrong is a 25 y.o. G2P2001 6145w3d s/p rLTCS  Discharge home Continue routine PP care Breastfeeding support PRN  LOS: 5 days   Taye T Gonfa 01/26/2016, 7:36 AM

## 2018-03-19 ENCOUNTER — Emergency Department (HOSPITAL_COMMUNITY): Payer: Self-pay

## 2018-03-19 ENCOUNTER — Other Ambulatory Visit: Payer: Self-pay

## 2018-03-19 ENCOUNTER — Encounter (HOSPITAL_COMMUNITY): Payer: Self-pay | Admitting: Emergency Medicine

## 2018-03-19 ENCOUNTER — Emergency Department (HOSPITAL_COMMUNITY)
Admission: EM | Admit: 2018-03-19 | Discharge: 2018-03-19 | Disposition: A | Discharge status: Home / Self Care | Payer: Self-pay | Attending: Emergency Medicine | Admitting: Emergency Medicine

## 2018-03-19 DIAGNOSIS — Z79899 Other long term (current) drug therapy: Secondary | ICD-10-CM | POA: Insufficient documentation

## 2018-03-19 DIAGNOSIS — R81 Glycosuria: Secondary | ICD-10-CM | POA: Insufficient documentation

## 2018-03-19 DIAGNOSIS — R1013 Epigastric pain: Secondary | ICD-10-CM

## 2018-03-19 DIAGNOSIS — R112 Nausea with vomiting, unspecified: Secondary | ICD-10-CM | POA: Insufficient documentation

## 2018-03-19 DIAGNOSIS — R197 Diarrhea, unspecified: Secondary | ICD-10-CM

## 2018-03-19 LAB — URINALYSIS, ROUTINE W REFLEX MICROSCOPIC
BILIRUBIN URINE: NEGATIVE
Glucose, UA: 50 mg/dL — AB
Ketones, ur: 20 mg/dL — AB
LEUKOCYTES UA: NEGATIVE
Nitrite: NEGATIVE
PROTEIN: 30 mg/dL — AB
Specific Gravity, Urine: 1.027 (ref 1.005–1.030)
pH: 6 (ref 5.0–8.0)

## 2018-03-19 LAB — CBC
HCT: 44.8 % (ref 36.0–46.0)
Hemoglobin: 14 g/dL (ref 12.0–15.0)
MCH: 24.7 pg — ABNORMAL LOW (ref 26.0–34.0)
MCHC: 31.3 g/dL (ref 30.0–36.0)
MCV: 79 fL (ref 78.0–100.0)
Platelets: 268 10*3/uL (ref 150–400)
RBC: 5.67 MIL/uL — AB (ref 3.87–5.11)
RDW: 13.2 % (ref 11.5–15.5)
WBC: 16.8 10*3/uL — AB (ref 4.0–10.5)

## 2018-03-19 LAB — COMPREHENSIVE METABOLIC PANEL
ALT: 26 U/L (ref 0–44)
AST: 26 U/L (ref 15–41)
Albumin: 3.8 g/dL (ref 3.5–5.0)
Alkaline Phosphatase: 98 U/L (ref 38–126)
Anion gap: 11 (ref 5–15)
BUN: 12 mg/dL (ref 6–20)
CHLORIDE: 105 mmol/L (ref 98–111)
CO2: 22 mmol/L (ref 22–32)
Calcium: 8.9 mg/dL (ref 8.9–10.3)
Creatinine, Ser: 0.78 mg/dL (ref 0.44–1.00)
GFR calc Af Amer: >60 mL/min (ref >60)
Glucose, Bld: 133 mg/dL — ABNORMAL HIGH (ref 70–99)
POTASSIUM: 3.4 mmol/L — AB (ref 3.5–5.1)
Sodium: 138 mmol/L (ref 135–145)
Total Bilirubin: 0.6 mg/dL (ref 0.3–1.2)
Total Protein: 7.7 g/dL (ref 6.5–8.1)

## 2018-03-19 LAB — I-STAT BETA HCG BLOOD, ED (MC, WL, AP ONLY): I-stat hCG, quantitative: <5 m[IU]/mL (ref <5)

## 2018-03-19 LAB — LIPASE, BLOOD: LIPASE: 37 U/L (ref 11–51)

## 2018-03-19 LAB — I-STAT CG4 LACTIC ACID, ED: Lactic Acid, Venous: 1.72 mmol/L (ref 0.5–1.9)

## 2018-03-19 MED ORDER — ONDANSETRON 4 MG PO TBDP: ORAL_TABLET | ORAL | 0 refills | Status: DC

## 2018-03-19 MED ORDER — SODIUM CHLORIDE 0.9 % IV BOLUS
1000.0000 mL | Freq: Once | INTRAVENOUS | Status: AC
  Administered 2018-03-19: 1000 mL via INTRAVENOUS

## 2018-03-19 MED ORDER — FENTANYL CITRATE (PF) 100 MCG/2ML IJ SOLN
50.0000 ug | INTRAMUSCULAR | Status: DC | PRN
  Administered 2018-03-19: 50 ug via INTRAVENOUS
  Filled 2018-03-19: qty 2

## 2018-03-19 MED ORDER — ONDANSETRON HCL 4 MG/2ML IJ SOLN
4.0000 mg | Freq: Once | INTRAMUSCULAR | Status: AC
  Administered 2018-03-19: 4 mg via INTRAVENOUS
  Filled 2018-03-19: qty 2

## 2018-03-19 NOTE — ED Provider Notes (Signed)
[facility] Note   CSN: [id number] Arrival date & time: 03/19/18  [id number]     History   Chief Complaint Chief Complaint  Patient presents with  . Abdominal Pain  . Diarrhea  . Nausea    HPI Natalie Armstrong is a 27 y.o. female.  Patient with history of single kidney, obesity, C-section presents with abdominal discomfort upper, vomiting and diarrhea nonbloody recurrent since yesterday.  No significant sick contacts, no travel, no current antibiotics.  Patient has had her gallbladder removed in the past.     Past Medical History:  Diagnosis Date  . Anemia   . Chronic kidney disease   . Obesity   . Post term pregnancy, 41 weeks 01/21/2016    Patient Active Problem List   Diagnosis Date Noted  . S/P cesarean section 01/25/2016  . Post term pregnancy, 41 weeks 01/21/2016  . 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 Anemia affecting pregnancy 01/21/2016  . H/O: C-section 01/21/2016    Past Surgical History:  Procedure Laterality Date  . CESAREAN SECTION    . CESAREAN SECTION N/A 01/23/2016   Procedure: CESAREAN SECTION;  Surgeon: Yorkshire Bingharlie Pickens, MD;  [facility];  Service: Obstetrics;  Laterality: N/A;  . CHOLECYSTECTOMY    . NEPHRECTOMY       OB History    Gravida  2   Para  2   Term  2   Preterm      AB      Living  1     SAB      TAB      Ectopic      Multiple  0   Live Births  1            Home Medications    Prior to Admission medications   Medication Sig Start Date End Date Taking? Authorizing Provider  ibuprofen (ADVIL,MOTRIN) 600 MG tablet Take 1 tablet (600 mg total) by mouth every 6 (six) hours. Patient not taking: Reported on 03/19/2018 01/25/16   Almon HerculesGonfa, Taye T, MD  ondansetron (ZOFRAN ODT) 4 MG disintegrating tablet 4mg  ODT q4 hours prn nausea/vomit 03/19/18   Blane OharaZavitz, Adrick Kestler, MD  oxyCODONE (OXY IR/ROXICODONE) 5 MG immediate release tablet Take 1 tablet (5  mg total) by mouth every 8 (eight) hours as needed (Please trial ibuprofen and tylenol before using). Patient not taking: Reported on 03/19/2018 01/26/16   Olena LeatherwoodAguilar, Kelly M, MD  oxyCODONE-acetaminophen (ROXICET) 5-325 MG tablet Take 1-2 tablets by mouth every 6 (six) hours as needed for severe pain. Patient not taking: Reported on 03/19/2018 01/25/16   Almon HerculesGonfa, Taye T, MD    Family History No family history on file.  Social History Social History   Tobacco Use  . Smoking status: Never Smoker  . Smokeless tobacco: Never Used  Substance Use Topics  . Alcohol use: No  . Drug use: No     Allergies   Patient has no known allergies.   Review of Systems Review of Systems  Constitutional: Positive for appetite change. Negative for chills and fever.  HENT: Negative for congestion.   Eyes: Negative for visual disturbance.  Respiratory: Negative for shortness of breath.   Cardiovascular: Negative for chest pain.  Gastrointestinal: Positive for abdominal pain, nausea and vomiting.  Genitourinary: Negative for dysuria and flank pain.  Musculoskeletal: Negative for back pain, neck pain and neck stiffness.  Skin: Negative for rash.  Neurological: Negative for light-headedness and headaches.  Physical Exam Updated Vital Signs BP 109/81 (BP Location: Right Arm)   Pulse (!) 126   Temp 98.9 F (37.2 C) (Oral)   Resp 17   Wt 94.3 kg (208 lb)   LMP 03/06/2018   SpO2 99%   BMI 42.01 kg/m   Physical Exam  Constitutional: She is oriented to person, place, and time. She appears well-developed and well-nourished.  HENT:  Head: Normocephalic and atraumatic.  Dry mm  Eyes: Conjunctivae are normal. Right eye exhibits no discharge. Left eye exhibits no discharge.  Neck: Normal range of motion. Neck supple. No tracheal deviation present.  Cardiovascular: Regular rhythm. Tachycardia present.  Pulmonary/Chest: Effort normal and breath sounds normal.  Abdominal: Soft. She exhibits no  distension. There is tenderness (upper abd). There is no guarding.  Musculoskeletal: She exhibits no edema.  Neurological: She is alert and oriented to person, place, and time.  Skin: Skin is warm. No rash noted.  Psychiatric: She has a normal mood and affect.  Nursing note and vitals reviewed.    ED Treatments / Results  Labs (all labs ordered are listed, but only abnormal results are displayed) Labs Reviewed  COMPREHENSIVE METABOLIC PANEL - Abnormal; Notable for the following components:      Result Value   Potassium 3.4 (*)    Glucose, Bld 133 (*)    All other components within normal limits  CBC - Abnormal; Notable for the following components:   WBC 16.8 (*)    RBC 5.67 (*)    MCH 24.7 (*)    All other components within normal limits  URINALYSIS, ROUTINE W REFLEX MICROSCOPIC - Abnormal; Notable for the following components:   APPearance CLOUDY (*)    Glucose, UA 50 (*)    Hgb urine dipstick SMALL (*)    Ketones, ur 20 (*)    Protein, ur 30 (*)    Bacteria, UA MANY (*)    All other components within normal limits  URINE CULTURE  LIPASE, BLOOD  I-STAT BETA HCG BLOOD, ED ([facility], [facility], [facility] ONLY)    EKG None  Radiology No results found.  Procedures Procedures (including critical care time)  Medications Ordered in ED Medications  fentaNYL (SUBLIMAZE) injection 50 mcg (50 mcg Intravenous Given 03/19/18 0906)  sodium chloride 0.9 % bolus 1,000 mL (0 mLs Intravenous Stopped 03/19/18 1007)  ondansetron (ZOFRAN) injection 4 mg (4 mg Intravenous Given 03/19/18 0906)     Initial Impression / Assessment and Plan / ED Course  I have reviewed the triage vital signs and the nursing notes.  Pertinent labs & imaging results that were available during my care of the patient were reviewed by me and considered in my medical decision making (see chart for details).    Patient presents with worsening abdominal pain nausea vomiting diarrhea and clinical concern for viral process at this  time.  Patient did improve with pain meds, IV fluids.  On discharge patient's heart rate still 120, canceled discharge and plan for CT scan to look for more significant infection, repeat IV fluids ordered, lactate added.  Delay in urinalysis, glucose in the urine discussed patient will need close follow-up for this, no anion gap and no history of diabetes.  CT scan done without contrast due to single kidney, no acute findings on CT read by radiology.  Heart rate improved to less than 100 after second bolus of fluids.  Patient will need close outpatient follow-up.  Zofran as needed. Leukocytosis on blood work without any clinical exam findings  of serious bacterial infection.  Results and differential diagnosis were discussed with the patient/parent/guardian. Xrays were independently reviewed by myself.  Close follow up outpatient was discussed, comfortable with the plan.   Medications  fentaNYL (SUBLIMAZE) injection 50 mcg (50 mcg Intravenous Given 03/19/18 0906)  sodium chloride 0.9 % bolus 1,000 mL (0 mLs Intravenous Stopped 03/19/18 1007)  ondansetron (ZOFRAN) injection 4 mg (4 mg Intravenous Given 03/19/18 0906)  sodium chloride 0.9 % bolus 1,000 mL (0 mLs Intravenous Stopped 03/19/18 1310)    Vitals:   03/19/18 0739 03/19/18 1200 03/19/18 1420 03/19/18 1515  BP:  121/73 100/60 113/69  Pulse:  (!) 122 98 (!) 104  Resp:      Temp: 98.9 F (37.2 C)  98.3 F (36.8 C)   TempSrc: Oral  Oral   SpO2:  98% 100% 100%  Weight:        Final diagnoses:  Nausea vomiting and diarrhea  Glucosuria  Epigastric pain     Final Clinical Impressions(s) / ED Diagnoses   Final diagnoses:  Nausea vomiting and diarrhea  Glucosuria  Epigastric pain    ED Discharge Orders        Ordered    ondansetron (ZOFRAN ODT) 4 MG disintegrating tablet     03/19/18 1153       Blane Ohara, MD 03/19/18 1521

## 2018-03-19 NOTE — Discharge Instructions (Signed)
Take zofran as needed for vomiting and nausea. Avoid processed foods, sweets, drinks with sugar. Follow up for diabetes testing this week. Return for right lower abd pain, fevers, or new concerns.

## 2018-03-20 LAB — URINE CULTURE: Culture: NO GROWTH

## 2018-03-24 ENCOUNTER — Ambulatory Visit: Payer: Self-pay

## 2018-03-24 ENCOUNTER — Encounter: Payer: Self-pay | Admitting: General Practice

## 2019-10-09 ENCOUNTER — Ambulatory Visit (INDEPENDENT_AMBULATORY_CARE_PROVIDER_SITE_OTHER): Payer: Self-pay | Admitting: Primary Care

## 2019-10-17 ENCOUNTER — Ambulatory Visit (INDEPENDENT_AMBULATORY_CARE_PROVIDER_SITE_OTHER): Payer: Self-pay | Admitting: Primary Care

## 2019-10-29 ENCOUNTER — Encounter (INDEPENDENT_AMBULATORY_CARE_PROVIDER_SITE_OTHER): Payer: Self-pay | Admitting: Primary Care

## 2019-10-29 ENCOUNTER — Other Ambulatory Visit: Payer: Self-pay

## 2019-10-29 ENCOUNTER — Ambulatory Visit (INDEPENDENT_AMBULATORY_CARE_PROVIDER_SITE_OTHER): Payer: Self-pay | Admitting: Primary Care

## 2019-10-29 VITALS — BP 118/81 | HR 88 | Temp 97.3°F | Ht 61.0 in | Wt 225.2 lb

## 2019-10-29 DIAGNOSIS — Z6841 Body Mass Index (BMI) 40.0 and over, adult: Secondary | ICD-10-CM

## 2019-10-29 DIAGNOSIS — Z7689 Persons encountering health services in other specified circumstances: Secondary | ICD-10-CM

## 2019-10-29 DIAGNOSIS — Z23 Encounter for immunization: Secondary | ICD-10-CM

## 2019-10-29 NOTE — Patient Instructions (Addendum)
Health Maintenance, Female Adopting a healthy lifestyle and getting preventive care are important in promoting health and wellness. Ask your health care provider about:  The right schedule for you to have regular tests and exams.  Things you can do on your own to prevent diseases and keep yourself healthy. What should I know about diet, weight, and exercise? Eat a healthy diet   Eat a diet that includes plenty of vegetables, fruits, low-fat dairy products, and lean protein.  Do not eat a lot of foods that are high in solid fats, added sugars, or sodium. Maintain a healthy weight Body mass index (BMI) is used to identify weight problems. It estimates body fat based on height and weight. Your health care provider can help determine your BMI and help you achieve or maintain a healthy weight. Get regular exercise Get regular exercise. This is one of the most important things you can do for your health. Most adults should:  Exercise for at least 150 minutes each week. The exercise should increase your heart rate and make you sweat (moderate-intensity exercise).  Do strengthening exercises at least twice a week. This is in addition to the moderate-intensity exercise.  Spend less time sitting. Even light physical activity can be beneficial. Watch cholesterol and blood lipids Have your blood tested for lipids and cholesterol at 31 years of age, then have this test every 5 years. Have your cholesterol levels checked more often if:  Your lipid or cholesterol levels are high.  You are older than 31 years of age.  You are at high risk for heart disease. What should I know about cancer screening? Depending on your health history and family history, you may need to have cancer screening at various ages. This may include screening for:  Breast cancer.  Cervical cancer.  Colorectal cancer.  Skin cancer.  Lung cancer. What should I know about heart disease, diabetes, and high blood  pressure? Blood pressure and heart disease  High blood pressure causes heart disease and increases the risk of stroke. This is more likely to develop in people who have high blood pressure readings, are of African descent, or are overweight.  Have your blood pressure checked: ? Every 3-5 years if you are 18-39 years of age. ? Every year if you are 40 years old or older. Diabetes Have regular diabetes screenings. This checks your fasting blood sugar level. Have the screening done:  Once every three years after age 40 if you are at a normal weight and have a low risk for diabetes.  More often and at a younger age if you are overweight or have a high risk for diabetes. What should I know about preventing infection? Hepatitis B If you have a higher risk for hepatitis B, you should be screened for this virus. Talk with your health care provider to find out if you are at risk for hepatitis B infection. Hepatitis C Testing is recommended for:  Everyone born from 1945 through 1965.  Anyone with known risk factors for hepatitis C. Sexually transmitted infections (STIs)  Get screened for STIs, including gonorrhea and chlamydia, if: ? You are sexually active and are younger than 31 years of age. ? You are older than 31 years of age and your health care provider tells you that you are at risk for this type of infection. ? Your sexual activity has changed since you were last screened, and you are at increased risk for chlamydia or gonorrhea. Ask your health care provider if   you are at risk.  Ask your health care provider about whether you are at high risk for HIV. Your health care provider may recommend a prescription medicine to help prevent HIV infection. If you choose to take medicine to prevent HIV, you should first get tested for HIV. You should then be tested every 3 months for as long as you are taking the medicine. Pregnancy  If you are about to stop having your period (premenopausal) and  you may become pregnant, seek counseling before you get pregnant.  Take 400 to 800 micrograms (mcg) of folic acid every day if you become pregnant.  Ask for birth control (contraception) if you want to prevent pregnancy. Osteoporosis and menopause Osteoporosis is a disease in which the bones lose minerals and strength with aging. This can result in bone fractures. If you are 31 years old or older, or if you are at risk for osteoporosis and fractures, ask your health care provider if you should:  Be screened for bone loss.  Take a calcium or vitamin D supplement to lower your risk of fractures.  Be given hormone replacement therapy (HRT) to treat symptoms of menopause. Follow these instructions at home: Lifestyle  Do not use any products that contain nicotine or tobacco, such as cigarettes, e-cigarettes, and chewing tobacco. If you need help quitting, ask your health care provider.  Do not use street drugs.  Do not share needles.  Ask your health care provider for help if you need support or information about quitting drugs. Alcohol use  Do not drink alcohol if: ? Your health care provider tells you not to drink. ? You are pregnant, may be pregnant, or are planning to become pregnant.  If you drink alcohol: ? Limit how much you use to 0-1 drink a day. ? Limit intake if you are breastfeeding.  Be aware of how much alcohol is in your drink. In the [place] one drink equals one 12 oz bottle of beer (355 mL), one 5 oz glass of wine (148 mL), or one 1 oz glass of hard liquor (44 mL). General instructions  Schedule regular health, dental, and eye exams.  Stay current with your vaccines.  Tell your health care provider if: ? You often feel depressed. ? You have ever been abused or do not feel safe at home. Summary  Adopting a healthy lifestyle and getting preventive care are important in promoting health and wellness.  Follow your health care provider's instructions about healthy  diet, exercising, and getting tested or screened for diseases.  Follow your health care provider's instructions on monitoring your cholesterol and blood pressure. This information is not intended to replace advice given to you by your health care provider. Make sure you discuss any questions you have with your health care provider. Document Revised: 08/02/2018 Document Reviewed: 08/02/2018 Elsevier Patient Education  2020 [employer]. https://www.cdc.gov/vaccines/hcp/vis/vis-statements/tdap.pdf">  Edward Jolly Tdap (ttanos, difteria y tos [place]): lo que debe saber Tdap (Tetanus, Diphtheria, Pertussis) Vaccine: What You Need to Know 1. Por qu vacunarse? La vacuna Tdap puede prevenir el ttanos, la difteria y la tos [place]. La difteria y la tos Dyann Ruddle se [place] de persona a Nurse, learning disability. El ttanos ingresa al organismo a travs de cortes o heridas. El [place] (T) provoca rigidez dolorosa en los msculos. El ttanos puede causar graves problemas de [place], como no poder abrir la boca, Best boy dificultad para tragar y Ambulance person, o la muerte. La DIFTERIA (D) puede causar dificultad para respirar, insuficiencia cardaca, parlisis o muerte. La  TOS FERINA (aP) tambin conocida como "tos convulsa" puede causar tos violenta e incontrolable lo que hace difcil respirar, comer o beber. La tos ferina puede ser muy grave en los bebs y en los nios pequeos, y causar neumona, convulsiones, dao cerebral o la [place]. En adolescentes y [address], puede causar prdida de [place], prdida del control de la vejiga, [place] y fracturas de Forensic psychologist al toser de Wellsite geologist intensa. 2. Madilyn Fireman Tdap La vacuna Tdap es solo para nios de 7 aos en adelante, adolescentes y [place].  Los adolescentes debe recibir una dosis nica de la vacuna Tdap, preferentemente a los 11 o 12 aos. Las mujeres embarazadas deben recibir una dosis de la vacuna Tdap en cada embarazo para proteger al recin nacido de la tos [place]. Los bebs tienen mayor  riesgo de sufrir complicaciones graves y potencialmente mortales debido a la tos [place]. Los adultos que nunca recibieron la vacuna Tdap deben recibir una dosis. Adems, los adultos deben recibir una dosis de refuerzo cada 10 aos, o antes si la persona sufre una [place] o una herida grave y [place]. Las dosis de refuerzo pueden ser de la vacuna Tdap o la Td (una vacuna diferente que protege contra el ttanos y la difteria, pero no contra la tos [place]). La vacuna Tdap puede ser administrada al mismo tiempo que otras vacunas. 3. Hable con el mdico Comunquese con la persona que le coloca las vacunas si la persona que la recibe: Ha tenido una reaccin alrgica despus de Neomia Dear dosis anterior de cualquier vacuna contra el ttanos, la difteria o la tos [place], o cualquier alergia grave, potencialmente mortal. Ha tenido un coma, disminucin del nivel de la conciencia o convulsiones prolongadas dentro de los [address] posteriores a una dosis anterior de cualquier vacuna contra la tos ferina (DTP, DTaP o Tdap). Tiene convulsiones u otro problema del sistema nervioso. Alguna vez tuvo sndrome de Guillain-Barr (tambin llamado SGB). Ha tenido dolor intenso o hinchazn despus de una dosis anterior de cualquier vacuna contra el ttanos o la difteria. En algunos casos, es posible que el mdico decida posponer la aplicacin de la vacuna Tdap para una visita en el futuro.  Las personas que sufren trastornos menores, como un resfro, pueden vacunarse. Las personas que tienen enfermedades moderadas o graves generalmente deben esperar hasta recuperarse para poder recibir la vacuna Tdap.  Su mdico puede darle ms informacin. 4. Riesgos de [place] reaccin a la vacuna [place] de recibir la vacuna Tdap a veces se puede Surveyor, mining, enrojecimiento o Paramedic donde se aplic la inyeccin, fiebre leve, dolor de cabeza, sensacin de cansancio y nuseas, vmitos, diarrea o dolor de [place]. Las personas a veces se  desmayan despus de procedimientos mdicos, incluida la vacunacin. Informe al mdico si se siente mareado, tiene cambios en la visin o zumbidos en los odos.  Al igual que con cualquier Automatic Data, existe una probabilidad muy remota de que una vacuna cause una reaccin alrgica grave, otra lesin grave o la muerte. 5. Qu pasa si se presenta un problema grave? Podra producirse una reaccin alrgica despus de que la persona vacunada abandone la clnica. Si observa signos de Runner, broadcasting/film/video grave (ronchas, hinchazn de la cara y la garganta, dificultad para respirar, latidos cardacos acelerados, mareos o debilidad), llame al 9-1-1 y lleve a la persona al hospital ms cercano. Si se presentan otros signos que le preocupan, comunquese con su mdico.  Las reacciones adversas deben informarse al Sistema de Informe de Eventos Adversos de Administrator, arts (  Vaccine Adverse Event Reporting System, VAERS). Por lo general, el mdico presenta este informe o puede hacerlo usted mismo. Visite el sitio web del VAERS en www.vaers.LAgents.no o llame al 825-150-4519. El VAERS es solo para Biomedical engineer; su personal no proporciona asesoramiento mdico. 6. Programa Nacional de Compensacin de Daos por [employer] El [employer] de Compensacin de Daos por Administrator, arts (National Vaccine Injury [employer], Cabin crew) es un programa federal que fue creado para Patent examiner a las personas que puedan haber sufrido daos al recibir ciertas vacunas. Visite el sitio web del VICP en SpiritualWord.at o llame al 1-(910) [phone] para obtener ms informacin acerca del programa y de cmo presentar un reclamo. Hay un lmite de tiempo para presentar un reclamo de compensacin. 7. Cmo puedo obtener ms informacin? Pregntele a su mdico. Comunquese con el servicio de salud de su localidad o su estado. Comunquese con los Marine scientist for [employer] and Prevention, CDC (Centros para el Control y la Prevencin de  [place]): Llame al [phone] (1-800-CDC-INFO) o Visite el sitio web de los CDC en PicCapture.uy Declaracin de informacin de la vacuna Tdap (ttanos, difteria y [place]) (08/26/2018) Esta informacin no tiene Theme park manager el consejo del mdico. Asegrese de hacerle al mdico cualquier pregunta que tenga. Document Revised: 12/19/2018 Document Reviewed: 12/19/2018 Elsevier Patient Education  2020 [employer].

## 2019-10-29 NOTE — Progress Notes (Signed)
New Patient Office Visit  Subjective:  Patient ID: Natalie Armstrong, female    DOB: [birth year]  Age: 29 y.o. MRN: [id number]  CC:  Chief Complaint  Patient presents with  . New Patient (Initial Visit)    HPI Natalie Armstrong presents for establish care . She voices she has an explanon in her right arm 31/2 years placed by the health department. She has been trying to schedule an appointment with the health ment and no availability.   Past Medical History:  Diagnosis Date  . Anemia   . Chronic kidney disease   . Obesity   . Post term pregnancy, 41 weeks 01/21/2016    Past Surgical History:  Procedure Laterality Date  . CESAREAN SECTION    . CESAREAN SECTION N/A 01/23/2016   Procedure: CESAREAN SECTION;  Surgeon: Aletha Halim, MD;  Location: [place];  Service: Obstetrics;  Laterality: N/A;  . CHOLECYSTECTOMY    . NEPHRECTOMY      No family history on file.  Social History   Socioeconomic History  . Marital status: Single    Spouse name: Not on file  . Number of children: Not on file  . Years of education: Not on file  . Highest education level: Not on file  Occupational History  . Not on file  Tobacco Use  . Smoking status: Never Smoker  . Smokeless tobacco: Never Used  Substance and Sexual Activity  . Alcohol use: No  . Drug use: No  . Sexual activity: Yes  Other Topics Concern  . Not on file  Social History Narrative  . Not on file   Social Determinants of Health   Financial Resource Strain:   . Difficulty of Paying Living Expenses: Not on file  Food Insecurity:   . Worried About Charity fundraiser in the Last Year: Not on file  . Ran Out of Food in the Last Year: Not on file  Transportation Needs:   . Lack of Transportation (Medical): Not on file  . Lack of Transportation (Non-Medical): Not on file  Physical Activity:   . Days of Exercise per Week: Not on file  . Minutes of Exercise per Session: Not on file  Stress:   .  Feeling of Stress : Not on file  Social Connections:   . Frequency of Communication with Friends and Family: Not on file  . Frequency of Social Gatherings with Friends and Family: Not on file  . Attends Religious Services: Not on file  . Active Member of Clubs or Organizations: Not on file  . Attends Archivist Meetings: Not on file  . Marital Status: Not on file  Intimate Partner Violence:   . Fear of Current or Ex-Partner: Not on file  . Emotionally Abused: Not on file  . Physically Abused: Not on file  . Sexually Abused: Not on file    ROS Review of Systems  All other systems reviewed and are negative.   Objective:   Today's Vitals: BP 118/81 (BP Location: Right Arm, Patient Position: Sitting, Cuff Size: Large)   Pulse 88   Temp (!) 97.3 F (36.3 C) (Temporal)   Ht 5\' 1"  (1.549 m)   Wt 225 lb 3.2 oz (102.2 kg)   LMP 10/22/2019 (Exact Date)   SpO2 95%   Breastfeeding No   BMI 42.55 kg/m   Physical Exam Vitals reviewed.  Constitutional:      Appearance: Normal appearance. She is obese.     Comments:  morbid  HENT:     Head: Normocephalic.     Nose: Nose normal.  Eyes:     Extraocular Movements: Extraocular movements intact.     Pupils: Pupils are equal, round, and reactive to light.  Cardiovascular:     Rate and Rhythm: Normal rate and regular rhythm.     Pulses: Normal pulses.     Heart sounds: Normal heart sounds.  Pulmonary:     Effort: Pulmonary effort is normal.     Breath sounds: Normal breath sounds.  Abdominal:     General: Bowel sounds are normal.  Musculoskeletal:        General: Normal range of motion.     Cervical back: Normal range of motion and neck supple.  Skin:    General: Skin is warm and dry.  Neurological:     Mental Status: She is alert and oriented to person, place, and time.  Psychiatric:        Mood and Affect: Mood normal.        Behavior: Behavior normal.        Thought Content: Thought content normal.         Judgment: Judgment normal.     Assessment & Plan:  Natalie Armstrong was seen today for new patient (initial visit).  Diagnoses and all orders for this visit:  Need for Tdap vaccination Tdap is recommended every 10 years for adults .  At least 1 of those doses should be with Tdap in adults age 13 and older who have previously received Tdap.  Recommend by the CDC. -     Tdap vaccine greater than or equal to 7yo IM  Encounter to establish care Natalie Passe, NP-C will be your  (PCP) she is mastered prepared . She is skilled to diagnosed and treat illness. Also able to answer health concern as well as continuing care of varied medical conditions, not limited by cause, organ system, or diagnosis.   -     CBC with Differential -     Comprehensive metabolic panel  Morbid obesity (HCC) Obesity is 30-39 indicating an excess in caloric intake or underlining conditions. This may lead to other co-morbidities-examples hypertension , cardiovascular disease, and complications with respiration, lifestyle modifications of diet and exercise may reduce obesity.  -     Lipid Panel -     TSH + free T4    Outpatient Encounter Medications as of 10/29/2019  Medication Sig  . [DISCONTINUED] ibuprofen (ADVIL,MOTRIN) 600 MG tablet Take 1 tablet (600 mg total) by mouth every 6 (six) hours. (Patient not taking: Reported on 03/19/2018)  . [DISCONTINUED] ondansetron (ZOFRAN ODT) 4 MG disintegrating tablet 4mg  ODT q4 hours prn nausea/vomit  . [DISCONTINUED] oxyCODONE (OXY IR/ROXICODONE) 5 MG immediate release tablet Take 1 tablet (5 mg total) by mouth every 8 (eight) hours as needed (Please trial ibuprofen and tylenol before using). (Patient not taking: Reported on 03/19/2018)  . [DISCONTINUED] oxyCODONE-acetaminophen (ROXICET) 5-325 MG tablet Take 1-2 tablets by mouth every 6 (six) hours as needed for severe pain. (Patient not taking: Reported on 03/19/2018)   No facility-administered encounter medications on file as of  10/29/2019.    Follow-up: Return for Pap.   12/29/2019, NP

## 2019-10-30 LAB — COMPREHENSIVE METABOLIC PANEL
ALT: 23 IU/L (ref 0–32)
AST: 24 IU/L (ref 0–40)
Albumin/Globulin Ratio: 1.4 (ref 1.2–2.2)
Albumin: 4.3 g/dL (ref 3.9–5.0)
Alkaline Phosphatase: 113 IU/L (ref 39–117)
BUN/Creatinine Ratio: 14 (ref 9–23)
BUN: 9 mg/dL (ref 6–20)
Bilirubin Total: 0.3 mg/dL (ref 0.0–1.2)
CO2: 24 mmol/L (ref 20–29)
Calcium: 9.1 mg/dL (ref 8.7–10.2)
Chloride: 103 mmol/L (ref 96–106)
Creatinine, Ser: 0.65 mg/dL (ref 0.57–1.00)
GFR calc Af Amer: 138 mL/min/{1.73_m2} (ref >59)
GFR calc non Af Amer: 120 mL/min/{1.73_m2} (ref >59)
Globulin, Total: 3.1 g/dL (ref 1.5–4.5)
Glucose: 88 mg/dL (ref 65–99)
Potassium: 4.4 mmol/L (ref 3.5–5.2)
Sodium: 137 mmol/L (ref 134–144)
Total Protein: 7.4 g/dL (ref 6.0–8.5)

## 2019-10-30 LAB — LIPID PANEL
Chol/HDL Ratio: 3.6 ratio (ref 0.0–4.4)
Cholesterol, Total: 171 mg/dL (ref 100–199)
HDL: 47 mg/dL (ref >39)
LDL Chol Calc (NIH): 107 mg/dL — ABNORMAL HIGH (ref 0–99)
Triglycerides: 93 mg/dL (ref 0–149)
VLDL Cholesterol Cal: 17 mg/dL (ref 5–40)

## 2019-10-30 LAB — CBC WITH DIFFERENTIAL/PLATELET
Basophils Absolute: 0.1 10*3/uL (ref 0.0–0.2)
Basos: 1 %
EOS (ABSOLUTE): 0.2 10*3/uL (ref 0.0–0.4)
Eos: 2 %
Hematocrit: 41.5 % (ref 34.0–46.6)
Hemoglobin: 13.3 g/dL (ref 11.1–15.9)
Immature Grans (Abs): 0 10*3/uL (ref 0.0–0.1)
Immature Granulocytes: 0 %
Lymphocytes Absolute: 2.7 10*3/uL (ref 0.7–3.1)
Lymphs: 31 %
MCH: 25.4 pg — ABNORMAL LOW (ref 26.6–33.0)
MCHC: 32 g/dL (ref 31.5–35.7)
MCV: 79 fL (ref 79–97)
Monocytes Absolute: 0.6 10*3/uL (ref 0.1–0.9)
Monocytes: 7 %
Neutrophils Absolute: 5.1 10*3/uL (ref 1.4–7.0)
Neutrophils: 59 %
Platelets: 290 10*3/uL (ref 150–450)
RBC: 5.23 x10E6/uL (ref 3.77–5.28)
RDW: 12.8 % (ref 11.7–15.4)
WBC: 8.7 10*3/uL (ref 3.4–10.8)

## 2019-10-30 LAB — TSH+FREE T4
Free T4: 1.06 ng/dL (ref 0.82–1.77)
TSH: 1.66 u[IU]/mL (ref 0.450–4.500)

## 2019-11-08 ENCOUNTER — Telehealth (INDEPENDENT_AMBULATORY_CARE_PROVIDER_SITE_OTHER): Payer: Self-pay

## 2019-11-08 NOTE — Telephone Encounter (Signed)
Call placed to patient using pacific interpreter Ceasar [phone]). Left voicemail per DPR providing results. Voicemail informed patient that her labs were normal except elevated cholesterol. No medication at this time. Please decrease your fatty foods, red meat, cheese, milk and increase the amount of veggies and whole grains you eat. Please call [facility] at [phone] with any questions or concerns. Maryjean Morn, CMA

## 2019-11-08 NOTE — Telephone Encounter (Signed)
-----   Message from Grayce Sessions, NP sent at 10/31/2019  5:17 PM EST ----- Labs are normal except LDL is slightly elevated .To lower your number you can decrease your fatty foods, red meat, cheese, milk and increase fiber like whole grains and veggies. You can also add a fiber supplement like Metamucil or Benefiber.

## 2019-11-12 ENCOUNTER — Ambulatory Visit (INDEPENDENT_AMBULATORY_CARE_PROVIDER_SITE_OTHER): Payer: Self-pay | Admitting: Family Medicine

## 2020-01-11 ENCOUNTER — Encounter (HOSPITAL_COMMUNITY): Payer: Self-pay | Admitting: Emergency Medicine

## 2020-01-11 ENCOUNTER — Emergency Department (HOSPITAL_COMMUNITY)
Admission: EM | Admit: 2020-01-11 | Discharge: 2020-01-12 | Disposition: A | Discharge status: Home / Self Care | Payer: Self-pay | Attending: Emergency Medicine | Admitting: Emergency Medicine

## 2020-01-11 ENCOUNTER — Other Ambulatory Visit: Payer: Self-pay

## 2020-01-11 ENCOUNTER — Emergency Department (HOSPITAL_COMMUNITY): Payer: Self-pay

## 2020-01-11 DIAGNOSIS — Z79899 Other long term (current) drug therapy: Secondary | ICD-10-CM | POA: Insufficient documentation

## 2020-01-11 DIAGNOSIS — U071 COVID-19: Secondary | ICD-10-CM | POA: Insufficient documentation

## 2020-01-11 DIAGNOSIS — R05 Cough: Secondary | ICD-10-CM | POA: Insufficient documentation

## 2020-01-11 LAB — CBC WITH DIFFERENTIAL/PLATELET
Abs Immature Granulocytes: 0.01 10*3/uL (ref 0.00–0.07)
Basophils Absolute: 0 10*3/uL (ref 0.0–0.1)
Basophils Relative: 0 %
Eosinophils Absolute: 0 10*3/uL (ref 0.0–0.5)
Eosinophils Relative: 0 %
HCT: 41.2 % (ref 36.0–46.0)
Hemoglobin: 13.2 g/dL (ref 12.0–15.0)
Immature Granulocytes: 0 %
Lymphocytes Relative: 37 %
Lymphs Abs: 1.4 10*3/uL (ref 0.7–4.0)
MCH: 25.4 pg — ABNORMAL LOW (ref 26.0–34.0)
MCHC: 32 g/dL (ref 30.0–36.0)
MCV: 79.4 fL — ABNORMAL LOW (ref 80.0–100.0)
Monocytes Absolute: 0.2 10*3/uL (ref 0.1–1.0)
Monocytes Relative: 5 %
Neutro Abs: 2.1 10*3/uL (ref 1.7–7.7)
Neutrophils Relative %: 58 %
Platelets: 170 10*3/uL (ref 150–400)
RBC: 5.19 MIL/uL — ABNORMAL HIGH (ref 3.87–5.11)
RDW: 12.8 % (ref 11.5–15.5)
WBC: 3.7 10*3/uL — ABNORMAL LOW (ref 4.0–10.5)
nRBC: 0 % (ref 0.0–0.2)

## 2020-01-11 LAB — COMPREHENSIVE METABOLIC PANEL
ALT: 34 U/L (ref 0–44)
AST: 42 U/L — ABNORMAL HIGH (ref 15–41)
Albumin: 3.1 g/dL — ABNORMAL LOW (ref 3.5–5.0)
Alkaline Phosphatase: 93 U/L (ref 38–126)
Anion gap: 14 (ref 5–15)
BUN: 5 mg/dL — ABNORMAL LOW (ref 6–20)
CO2: 21 mmol/L — ABNORMAL LOW (ref 22–32)
Calcium: 8.2 mg/dL — ABNORMAL LOW (ref 8.9–10.3)
Chloride: 102 mmol/L (ref 98–111)
Creatinine, Ser: 0.79 mg/dL (ref 0.44–1.00)
GFR calc Af Amer: >60 mL/min (ref >60)
GFR calc non Af Amer: >60 mL/min (ref >60)
Glucose, Bld: 136 mg/dL — ABNORMAL HIGH (ref 70–99)
Potassium: 3.8 mmol/L (ref 3.5–5.1)
Sodium: 137 mmol/L (ref 135–145)
Total Bilirubin: 0.6 mg/dL (ref 0.3–1.2)
Total Protein: 7 g/dL (ref 6.5–8.1)

## 2020-01-11 LAB — URINALYSIS, ROUTINE W REFLEX MICROSCOPIC
Bilirubin Urine: NEGATIVE
Glucose, UA: NEGATIVE mg/dL
Hgb urine dipstick: NEGATIVE
Ketones, ur: NEGATIVE mg/dL
Leukocytes,Ua: NEGATIVE
Nitrite: NEGATIVE
Protein, ur: NEGATIVE mg/dL
Specific Gravity, Urine: 1.01 (ref 1.005–1.030)
pH: 6 (ref 5.0–8.0)

## 2020-01-11 LAB — I-STAT BETA HCG BLOOD, ED (MC, WL, AP ONLY): I-stat hCG, quantitative: <5 m[IU]/mL (ref <5)

## 2020-01-11 MED ORDER — HYDROCODONE-HOMATROPINE 5-1.5 MG/5ML PO SYRP
5.0000 mL | ORAL_SOLUTION | Freq: Once | ORAL | Status: AC
  Administered 2020-01-12: 5 mL via ORAL
  Filled 2020-01-11: qty 5

## 2020-01-11 MED ORDER — ACETAMINOPHEN 500 MG PO TABS
1000.0000 mg | ORAL_TABLET | Freq: Once | ORAL | Status: AC
  Administered 2020-01-12: 1000 mg via ORAL
  Filled 2020-01-11: qty 2

## 2020-01-11 MED ORDER — AEROCHAMBER PLUS FLO-VU LARGE MISC
1.0000 | Freq: Once | Status: AC
  Administered 2020-01-11: 1

## 2020-01-11 MED ORDER — SODIUM CHLORIDE 0.9 % IV BOLUS
500.0000 mL | Freq: Once | INTRAVENOUS | Status: AC
  Administered 2020-01-12: 500 mL via INTRAVENOUS

## 2020-01-11 MED ORDER — ALBUTEROL SULFATE HFA 108 (90 BASE) MCG/ACT IN AERS
6.0000 | INHALATION_SPRAY | Freq: Once | RESPIRATORY_TRACT | Status: AC
  Administered 2020-01-11: 6 via RESPIRATORY_TRACT
  Filled 2020-01-11: qty 6.7

## 2020-01-11 NOTE — ED Provider Notes (Signed)
[facility] EMERGENCY DEPARTMENT Provider Note   CSN: [id number] Arrival date & time: 01/11/20  2141     History Chief Complaint  Patient presents with  . Covid+ / Fever/Body Aches/Chills    Natalie Armstrong is a 29 y.o. female with a hx of anemia, CKD, obesity presents to the Emergency Department complaining of gradual, persistent, progressively worsening flu-like symptoms onset 3 days. Pt reports + COVID test on Wednesday.  Associated symptoms include fever, cough, body aches, headache, abdominal cramping, nausea and diarrhea.  Pt reports taking tylenol without significant relief.  Pt reports no aggravating symptoms.  Denies leg swelling, Hx of DVT, immobilization, OCP.  Pt also c/o chest pain, pain with breathing and shortness of breath at rest.    The history is provided by the patient and medical records. The history is limited by a language barrier. A language interpreter was used.       Past Medical History:  Diagnosis Date  . Anemia   . Chronic kidney disease   . Obesity   . Post term pregnancy, 41 weeks 01/21/2016    Patient Active Problem List   Diagnosis Date Noted  . S/P cesarean section 01/25/2016  . Post term pregnancy, 41 weeks 01/21/2016  . 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 Anemia affecting pregnancy 01/21/2016  . H/O: C-section 01/21/2016    Past Surgical History:  Procedure Laterality Date  . CESAREAN SECTION    . CESAREAN SECTION N/A 01/23/2016   Procedure: CESAREAN SECTION;  Surgeon: Fraser Bing, MD;  Location: [facility] B[facility] SU[facility];  Service: Obstetrics;  Laterality: N/A;  . CHOLECYSTECTOMY    . NEPHRECTOMY       OB History    Gravida  2   Para  2   Term  2   Preterm      AB      Living  1     SAB      TAB      Ectopic      Multiple  0   Live Births  1           No family history on file.  Social History   Tobacco Use  . Smoking status: Never Smoker  . Smokeless tobacco: Never  Used  Substance Use Topics  . Alcohol use: No  . Drug use: No    Home Medications Prior to Admission medications   Medication Sig Start Date End Date Taking? Authorizing Provider  albuterol (VENTOLIN HFA) 108 (90 Base) MCG/ACT inhaler Inhale 2 puffs into the lungs every 2 (two) hours as needed for wheezing or shortness of breath (cough). 01/12/20   Larisha Vencill, Dahlia Client, PA-C  promethazine-dextromethorphan (PROMETHAZINE-DM) 6.25-15 MG/5ML syrup Take 5 mLs by mouth 4 (four) times daily as needed for cough. 01/12/20   Melodie Ashworth, Dahlia Client, PA-C    Allergies    Patient has no known allergies.  Review of Systems   Review of Systems  Constitutional: Positive for chills, fatigue and fever. Negative for appetite change, diaphoresis and unexpected weight change.  HENT: Positive for congestion, rhinorrhea and sore throat. Negative for mouth sores.   Eyes: Negative for visual disturbance.  Respiratory: Positive for cough, shortness of breath and wheezing. Negative for chest tightness.   Cardiovascular: Negative for chest pain.  Gastrointestinal: Positive for abdominal pain, diarrhea and nausea. Negative for constipation and vomiting.  Endocrine: Negative for polydipsia, polyphagia and polyuria.  Genitourinary: Negative for dysuria, frequency, hematuria and urgency.  Musculoskeletal: Negative for back pain  and neck stiffness.  Skin: Negative for rash.  Allergic/Immunologic: Negative for immunocompromised state.  Neurological: Positive for headaches. Negative for syncope and light-headedness.  Hematological: Does not bruise/bleed easily.  Psychiatric/Behavioral: Negative for sleep disturbance. The patient is not nervous/anxious.     Physical Exam Updated Vital Signs BP 115/78 (BP Location: Left Arm)   Pulse (!) 112   Temp 98.9 F (37.2 C) (Oral)   Resp 19   Ht 5\' 4"  (1.626 m)   Wt 110 kg   LMP 12/31/2019   SpO2 99%   BMI 41.63 kg/m   Physical Exam Vitals and nursing note reviewed.    Constitutional:      General: She is not in acute distress.    Appearance: She is not diaphoretic.  HENT:     Head: Normocephalic.  Eyes:     General: No scleral icterus.    Conjunctiva/sclera: Conjunctivae normal.  Cardiovascular:     Rate and Rhythm: Regular rhythm. Tachycardia present.     Pulses: Normal pulses.          Radial pulses are 2+ on the right side and 2+ on the left side.  Pulmonary:     Effort: Tachypnea and accessory muscle usage present. No prolonged expiration, respiratory distress or retractions.     Breath sounds: No stridor. Wheezing (throughout) present.     Comments: Equal chest rise. Mild increased work of breathing. Abdominal:     General: There is no distension.     Palpations: Abdomen is soft.     Tenderness: There is no abdominal tenderness. There is no guarding or rebound.  Musculoskeletal:     Cervical back: Normal range of motion.     Comments: Moves all extremities equally and without difficulty.  Skin:    General: Skin is warm and dry.     Capillary Refill: Capillary refill takes less than 2 seconds.  Neurological:     Mental Status: She is alert.     GCS: GCS eye subscore is 4. GCS verbal subscore is 5. GCS motor subscore is 6.     Comments: Speech is clear and goal oriented.  Psychiatric:        Mood and Affect: Mood normal.     ED Results / Procedures / Treatments   Labs (all labs ordered are listed, but only abnormal results are displayed) Labs Reviewed  CBC WITH DIFFERENTIAL/PLATELET - Abnormal; Notable for the following components:      Result Value   WBC 3.7 (*)    RBC 5.19 (*)    MCV 79.4 (*)    MCH 25.4 (*)    All other components within normal limits  COMPREHENSIVE METABOLIC PANEL - Abnormal; Notable for the following components:   CO2 21 (*)    Glucose, Bld 136 (*)    BUN 5 (*)    Calcium 8.2 (*)    Albumin 3.1 (*)    AST 42 (*)    All other components within normal limits  URINALYSIS, ROUTINE W REFLEX MICROSCOPIC   I-STAT BETA HCG BLOOD, ED ([facility], [facility], [facility] ONLY)    EKG EKG Interpretation  Date/Time:  Friday Jan 11 2020 21:50:51 EDT Ventricular Rate:  113 PR Interval:  138 QRS Duration: 82 QT Interval:  348 QTC Calculation: 477 R Axis:   50 Text Interpretation: Sinus tachycardia Nonspecific ST and T wave abnormality Abnormal ECG When compared with ECG of 11/12/2015, HEART RATE has decreased Confirmed by 11/14/2015 (Dione Booze) on 01/12/2020 2:41:59 AM  Radiology CT Angio Chest PE W and/or Wo Contrast  Result Date: 01/12/2020 CLINICAL DATA:  COVID-19 positive, PE suspected, high pretest probability EXAM: CT ANGIOGRAPHY CHEST WITH CONTRAST TECHNIQUE: Multidetector CT imaging of the chest was performed using the standard protocol during bolus administration of intravenous contrast. Multiplanar CT image reconstructions and MIPs were obtained to evaluate the vascular anatomy. CONTRAST:  181mL OMNIPAQUE IOHEXOL 350 MG/ML SOLN COMPARISON:  Chest radiograph 01/11/2020 FINDINGS: Cardiovascular: Satisfactory opacification of the pulmonary arteries. Extensive respiratory motion artifact may limit detection of smaller segmental and subsegmental pulmonary artery emboli. No large central or lobar filling defects are identified. Central pulmonary arteries are normal caliber. The aorta is normal caliber. A normal 3 vessel branching of the aortic arch. Proximal great vessels normally opacified. Normal heart size. No pericardial effusion. Mediastinum/Nodes: No mediastinal fluid or gas. Normal thyroid gland and thoracic inlet. No acute abnormality of the trachea or esophagus. Few low-attenuation subcentimeter mediastinal nodes are present, likely reactive. No worrisome mediastinal, hilar or axillary adenopathy. Lungs/Pleura: Multifocal areas of mixed ground-glass and consolidative opacity throughout both lungs with associated airways thickening and scattered secretions. Lung volumes are also notably diminished with some additional  dependent atelectatic changes. No pneumothorax or effusion. Upper Abdomen: No acute abnormalities present in the visualized portions of the upper abdomen. Musculoskeletal: Multilevel degenerative changes are present in the imaged portions of the spine. No acute osseous abnormality or suspicious osseous lesion. No worrisome chest wall lesions. Review of the MIP images confirms the above findings. IMPRESSION: 1. Respiratory motion artifact may limit detection of smaller segmental and subsegmental pulmonary artery emboli. No large central or lobar filling defects are identified. 2. Multifocal areas of mixed ground-glass and consolidative opacity throughout both lungs with associated airways thickening and scattered secretions. Findings compatible with a multifocal pneumonia compatible with a COVID-19 etiology. Electronically Signed   By: Lovena Le M.D.   On: 01/12/2020 03:29   DG Chest Portable 1 View  Result Date: 01/11/2020 CLINICAL DATA:  COVID-19 positive, cough, shortness of breath, fevers, chills EXAM: PORTABLE CHEST 1 VIEW COMPARISON:  None. FINDINGS: Single frontal view of the chest demonstrates an unremarkable cardiac silhouette. There is patchy bilateral airspace disease greatest at the lung bases. No effusion or pneumothorax. No acute bony abnormalities. IMPRESSION: 1. Findings compatible with multifocal bilateral pneumonia given history of COVID-19 positive test. Electronically Signed   By: Randa Ngo M.D.   On: 01/11/2020 23:07    Procedures Procedures (including critical care time)  Medications Ordered in ED Medications  sodium chloride 0.9 % bolus 500 mL (0 mLs Intravenous Stopped 01/12/20 0236)  acetaminophen (TYLENOL) tablet 1,000 mg (1,000 mg Oral Given 01/12/20 0108)  albuterol (VENTOLIN HFA) 108 (90 Base) MCG/ACT inhaler 6 puff (6 puffs Inhalation Given 01/11/20 2359)  AeroChamber Plus Flo-Vu Large MISC 1 each (1 each Other Given 01/11/20 2358)  HYDROcodone-homatropine (HYCODAN)  5-1.5 MG/5ML syrup 5 mL (5 mLs Oral Given 01/12/20 0108)  iohexol (OMNIPAQUE) 350 MG/ML injection 100 mL (100 mLs Intravenous Contrast Given 01/12/20 0252)    ED Course  I have reviewed the triage vital signs and the nursing notes.  Pertinent labs & imaging results that were available during my care of the patient were reviewed by me and considered in my medical decision making (see chart for details).  Clinical Course as of Jan 12 631  Fri Jan 11, 2020  2350 Hc of CKD, but WNL today   Creatinine: 0.79 [HM]    Clinical Course User Index [HM] Clinton,  Boyd Kerbs   MDM Rules/Calculators/A&P                      Aidyn Sportsman was evaluated in Emergency Department on 01/12/2020 for the symptoms described in the history of present illness. She was evaluated in the context of the global COVID-19 pandemic, which necessitated consideration that the patient might be at risk for infection with the SARS-CoV-2 virus that causes COVID-19. Institutional protocols and algorithms that pertain to the evaluation of patients at risk for COVID-19 are in a state of rapid change based on information released by regulatory bodies including the CDC and federal and state organizations. These policies and algorithms were followed during the patient's care in the ED.  Patient presents with chest pain shortness of breath and recent Covid diagnosis.  She has some persistent tachycardia here in the emergency department.  Oxygen saturation in the mid 90s at rest.  Concern for possible pulmonary embolism.  Labs consistent with Covid.  Additionally, chest x-ray shows groundglass opacities consistent with Covid.  I personally evaluated these images.  6:31 AM CT scan without evidence of pulmonary embolism.  Pneumonia is again noted.  Patient reports improved breathing after albuterol.  She ambulates in the room with oxygen saturations between 91 and 92%.  No significant increased work of breathing with  ambulation.  Patient reports she is feeling significantly better and that her breathing is significantly better as well.  Discussed with patient the possibility of worsening at home.  She states understanding.  Will discharge with albuterol, Promethazine DM.  Discussed reasons to return immediately to the emergency department including any worsening in shortness of breath.  Patient states understanding and is in agreement with the plan.   Final Clinical Impression(s) / ED Diagnoses Final diagnoses:  COVID-19    Rx / DC Orders ED Discharge Orders         Ordered    albuterol (VENTOLIN HFA) 108 (90 Base) MCG/ACT inhaler  Every 2 hours PRN     01/12/20 0615    promethazine-dextromethorphan (PROMETHAZINE-DM) 6.25-15 MG/5ML syrup  4 times daily PRN     01/12/20 0615           Natassia Guthridge, Boyd Kerbs 01/12/20 0704    Palumbo, April, MD 01/12/20 7096

## 2020-01-11 NOTE — ED Triage Notes (Signed)
Patient tested positive for COVID19 this week reports fever, chills , cough , and body aches onset this week .

## 2020-01-12 ENCOUNTER — Emergency Department (HOSPITAL_COMMUNITY): Payer: Self-pay

## 2020-01-12 MED ORDER — IOHEXOL 350 MG/ML SOLN
100.0000 mL | Freq: Once | INTRAVENOUS | Status: AC | PRN
  Administered 2020-01-12: 100 mL via INTRAVENOUS

## 2020-01-12 MED ORDER — ALBUTEROL SULFATE HFA 108 (90 BASE) MCG/ACT IN AERS: 2.0000 | INHALATION_SPRAY | RESPIRATORY_TRACT | 0 refills | Status: DC | PRN

## 2020-01-12 MED ORDER — PROMETHAZINE-DM 6.25-15 MG/5ML PO SYRP: 5.0000 mL | ORAL_SOLUTION | Freq: Four times a day (QID) | ORAL | 0 refills | Status: DC | PRN

## 2020-01-12 NOTE — ED Notes (Signed)
This RN unable to obtain IV for CTA, another RN will attempt

## 2020-01-12 NOTE — ED Notes (Signed)
Pt. Ambulated. Sats dropped from 96% to 91% and was SOB. MD notified.

## 2020-01-12 NOTE — Discharge Instructions (Signed)
1. Medications: Alternate tylenol and ibuprofen for fever control, albuterol for wheezing or breath, Promethazine DM for cough, continue usual home medications 2. Treatment: rest, drink plenty of fluids, isolate for the next 10 days 3. Follow Up: Please followup with your primary doctor if your symptoms are not improving after 10-14 days; Please return to the ER for high fevers, persistent vomiting, shortness of breath, increased work of breathing or other concerns.

## 2020-01-12 NOTE — ED Notes (Signed)
Pt transported to CT ?

## 2020-04-19 ENCOUNTER — Ambulatory Visit: Payer: Self-pay

## 2020-05-02 ENCOUNTER — Ambulatory Visit (INDEPENDENT_AMBULATORY_CARE_PROVIDER_SITE_OTHER): Payer: HRSA Program

## 2020-05-02 DIAGNOSIS — Z23 Encounter for immunization: Secondary | ICD-10-CM | POA: Diagnosis not present

## 2020-05-02 NOTE — Progress Notes (Signed)
   Covid-19 Vaccination Clinic  Name:  Abbygale Lapid    MRN: [id number] DOB: Jul 27, 1991  05/02/2020  Ms. Rosana Farnell was observed post Covid-19 immunization for 15 minutes without incident. She was provided with Vaccine Information Sheet and instruction to access the V-Safe system.   Ms. Rakeb Kibble was instructed to call 911 with any severe reactions post vaccine: Marland Kitchen Difficulty breathing  . Swelling of face and throat  . A fast heartbeat  . A bad rash all over body  . Dizziness and weakness   Immunizations Administered    Name Date Dose VIS Date Route   Pfizer COVID-19 Vaccine 05/02/2020 10:58 AM 0.3 mL 10/17/2018 Intramuscular   Manufacturer: [employer], [employer]   Lot: [id number]   NDC: [address]-2

## 2020-05-24 ENCOUNTER — Ambulatory Visit: Payer: Self-pay

## 2020-06-21 ENCOUNTER — Ambulatory Visit (INDEPENDENT_AMBULATORY_CARE_PROVIDER_SITE_OTHER): Payer: HRSA Program

## 2020-06-21 DIAGNOSIS — Z23 Encounter for immunization: Secondary | ICD-10-CM | POA: Diagnosis not present

## 2021-04-22 ENCOUNTER — Other Ambulatory Visit: Payer: Self-pay

## 2021-04-22 DIAGNOSIS — N6310 Unspecified lump in the right breast, unspecified quadrant: Secondary | ICD-10-CM

## 2021-05-05 ENCOUNTER — Ambulatory Visit: Payer: Self-pay | Admitting: *Deleted

## 2021-05-05 ENCOUNTER — Ambulatory Visit
Admission: RE | Admit: 2021-05-05 | Discharge: 2021-05-05 | Disposition: A | Discharge status: Home / Self Care | Payer: Self-pay | Source: Ambulatory Visit | Attending: Obstetrics and Gynecology | Admitting: Obstetrics and Gynecology

## 2021-05-05 ENCOUNTER — Ambulatory Visit
Admission: RE | Admit: 2021-05-05 | Discharge: 2021-05-05 | Disposition: A | Discharge status: Home / Self Care | Payer: No Typology Code available for payment source | Source: Ambulatory Visit | Attending: Obstetrics and Gynecology | Admitting: Obstetrics and Gynecology

## 2021-05-05 ENCOUNTER — Other Ambulatory Visit: Payer: Self-pay

## 2021-05-05 VITALS — BP 125/57 | Wt 190.9 lb

## 2021-05-05 DIAGNOSIS — N6310 Unspecified lump in the right breast, unspecified quadrant: Secondary | ICD-10-CM

## 2021-05-05 DIAGNOSIS — Z1239 Encounter for other screening for malignant neoplasm of breast: Secondary | ICD-10-CM

## 2021-05-05 DIAGNOSIS — N6313 Unspecified lump in the right breast, lower outer quadrant: Secondary | ICD-10-CM

## 2021-05-05 NOTE — Patient Instructions (Signed)
Explained breast self awareness with Jiles Crocker. Patient did not need a Pap smear today due to last Pap smear was 01/14/2021. 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 Referred patient to the [facility] for a diagnostic mammogram. Appointment scheduled Tuesday, May 05, 2021 at 1400. Patient aware of appointment and will be there. Jiles Crocker verbalized understanding.  Eragon Hammond, Kathaleen Maser, RN 11:31 AM

## 2021-05-05 NOTE — Progress Notes (Signed)
[name] is a 30 y.o. female who presents to [facility] clinic today with complaint of right nipple discharge x 4 years when expressed that is clear/oily. Patient referred to [facility] for two mobile right breast lumps that were palpated on exam 01/14/2021 at the [facility] Department. Patient stated she is unable to feel lumps.    Pap Smear: Pap smear not completed today. Last Pap smear was 01/14/2021 at the [facility] clinic and was normal with positive HPV. Per patient has no history of an abnormal Pap smear. Last Pap smear result is not available in Epic. Last Pap smear result will be scanned into Epic.   Physical exam: Breasts Breasts symmetrical. No skin abnormalities bilateral breasts. No nipple retraction bilateral breasts. No nipple discharge bilateral breasts. Unable to express any nipple discharge on exam. No lymphadenopathy. No lumps palpated left breast. Palpated a bb sized lump right breast at 7 o'clock next to areola. NO complaints of pain or tenderness on exam.    Pelvic/Bimanual Pap is not indicated today per BCCCP guidelines.   Smoking History: Patient has never smoked.   Patient Navigation: Patient education provided. Access to services provided for patient through [place] program. Spanish interpreter Natale Lay from [facility] provided.   Breast and Cervical Cancer Risk Assessment: Patient does not have family history of breast cancer, known genetic mutations, or radiation treatment to the chest before age 41. Patient does not have history of cervical dysplasia, immunocompromised, or DES exposure in-utero. Breast cancer risk assessment completed. No breast cancer risk calculated due to patient is less than 26 years old.  Risk Assessment     Risk Scores       05/05/2021   Last edited by: Narda Rutherford, LPN   5-year risk:    Lifetime risk:             A: BCCCP exam without pap smear Complaint of two right breast  lumps.  P: Referred patient to the [facility] for a diagnostic mammogram. Appointment scheduled Tuesday, May 05, 2021 at 1400.  Priscille Heidelberg, RN 05/05/2021 11:31 AM

## 2021-10-21 ENCOUNTER — Other Ambulatory Visit: Payer: Self-pay

## 2021-10-21 ENCOUNTER — Emergency Department (HOSPITAL_COMMUNITY)
Admission: EM | Admit: 2021-10-21 | Discharge: 2021-10-21 | Disposition: A | Discharge status: Home / Self Care | Payer: No Typology Code available for payment source | Attending: Emergency Medicine | Admitting: Emergency Medicine

## 2021-10-21 ENCOUNTER — Emergency Department (HOSPITAL_COMMUNITY): Payer: No Typology Code available for payment source

## 2021-10-21 ENCOUNTER — Encounter (HOSPITAL_COMMUNITY): Payer: Self-pay

## 2021-10-21 DIAGNOSIS — R002 Palpitations: Secondary | ICD-10-CM | POA: Insufficient documentation

## 2021-10-21 DIAGNOSIS — R12 Heartburn: Secondary | ICD-10-CM | POA: Insufficient documentation

## 2021-10-21 LAB — CBC WITH DIFFERENTIAL/PLATELET
Abs Immature Granulocytes: 0.01 10*3/uL (ref 0.00–0.07)
Basophils Absolute: 0 10*3/uL (ref 0.0–0.1)
Basophils Relative: 1 %
Eosinophils Absolute: 0.1 10*3/uL (ref 0.0–0.5)
Eosinophils Relative: 2 %
HCT: 42.3 % (ref 36.0–46.0)
Hemoglobin: 13.1 g/dL (ref 12.0–15.0)
Immature Granulocytes: 0 %
Lymphocytes Relative: 32 %
Lymphs Abs: 2.1 10*3/uL (ref 0.7–4.0)
MCH: 25.6 pg — ABNORMAL LOW (ref 26.0–34.0)
MCHC: 31 g/dL (ref 30.0–36.0)
MCV: 82.6 fL (ref 80.0–100.0)
Monocytes Absolute: 0.3 10*3/uL (ref 0.1–1.0)
Monocytes Relative: 5 %
Neutro Abs: 4 10*3/uL (ref 1.7–7.7)
Neutrophils Relative %: 60 %
Platelets: 237 10*3/uL (ref 150–400)
RBC: 5.12 MIL/uL — ABNORMAL HIGH (ref 3.87–5.11)
RDW: 13.8 % (ref 11.5–15.5)
WBC: 6.7 10*3/uL (ref 4.0–10.5)
nRBC: 0 % (ref 0.0–0.2)

## 2021-10-21 LAB — COMPREHENSIVE METABOLIC PANEL
ALT: 21 U/L (ref 0–44)
AST: 22 U/L (ref 15–41)
Albumin: 3.7 g/dL (ref 3.5–5.0)
Alkaline Phosphatase: 63 U/L (ref 38–126)
Anion gap: 10 (ref 5–15)
BUN: 8 mg/dL (ref 6–20)
CO2: 25 mmol/L (ref 22–32)
Calcium: 9.1 mg/dL (ref 8.9–10.3)
Chloride: 104 mmol/L (ref 98–111)
Creatinine, Ser: 0.77 mg/dL (ref 0.44–1.00)
GFR, Estimated: >60 mL/min (ref >60)
Glucose, Bld: 85 mg/dL (ref 70–99)
Potassium: 3.7 mmol/L (ref 3.5–5.1)
Sodium: 139 mmol/L (ref 135–145)
Total Bilirubin: 0.5 mg/dL (ref 0.3–1.2)
Total Protein: 6.9 g/dL (ref 6.5–8.1)

## 2021-10-21 LAB — URINALYSIS, ROUTINE W REFLEX MICROSCOPIC
Bilirubin Urine: NEGATIVE
Glucose, UA: NEGATIVE mg/dL
Hgb urine dipstick: NEGATIVE
Ketones, ur: NEGATIVE mg/dL
Leukocytes,Ua: NEGATIVE
Nitrite: NEGATIVE
Protein, ur: NEGATIVE mg/dL
Specific Gravity, Urine: 1.015 (ref 1.005–1.030)
pH: 8 (ref 5.0–8.0)

## 2021-10-21 LAB — HCG, QUANTITATIVE, PREGNANCY: hCG, Beta Chain, Quant, S: <1 m[IU]/mL (ref <5)

## 2021-10-21 LAB — LIPASE, BLOOD: Lipase: 36 U/L (ref 11–51)

## 2021-10-21 LAB — TROPONIN I (HIGH SENSITIVITY): Troponin I (High Sensitivity): <2 ng/L (ref <18)

## 2021-10-21 NOTE — ED Provider Notes (Signed)
?[facility] EMERGENCY DEPARTMENT ?Provider Note ? ? ?CSN: [id number] ?Arrival date & time: 10/21/21  7681 ? ?  ? ?History ? ?Chief Complaint  ?Patient presents with  ? Palpitations  ? ? ?Natalie Armstrong is a 31 y.o. female. ?Interviewed with Radiation protection practitioner. ?HPI ?Young female presenting for evaluation of sensation of palpitations and burning epigastric discomfort ongoing for 1 week.  She states that when she eats food, it feels "heavy.  She denies back pain, nausea, vomiting, diarrhea, fever, chills, cough or chest pain. ?  ? ?Home Medications ?Prior to Admission medications   ?Medication Sig Start Date End Date Taking? Authorizing Provider  ?albuterol (VENTOLIN HFA) 108 (90 Base) MCG/ACT inhaler Inhale 2 puffs into the lungs every 2 (two) hours as needed for wheezing or shortness of breath (cough). 01/12/20   Muthersbaugh, Dahlia Client, PA-C  ?levonorgestrel-ethinyl estradiol (NORDETTE) 0.15-30 MG-MCG tablet Take 1 tablet by mouth daily.    [provider]  ?promethazine-dextromethorphan (PROMETHAZINE-DM) 6.25-15 MG/5ML syrup Take 5 mLs by mouth 4 (four) times daily as needed for cough. 01/12/20   Muthersbaugh, Dahlia Client, PA-C  ?   ? ?Allergies    ?Patient has no known allergies.   ? ?Review of Systems   ?Review of Systems ? ?Physical Exam ?Updated Vital Signs ?BP (!) 119/95   Pulse 78   Temp 98.1 ?F (36.7 ?C) (Oral)   Resp (!) 22   Ht 5\' 4"  (1.626 m)   Wt 79.8 kg   LMP 10/03/2021   SpO2 100%   BMI 30.21 kg/m?  ?Physical Exam ?Vitals and nursing note reviewed.  ?Constitutional:   ?   Appearance: She is well-developed. She is not ill-appearing.  ?HENT:  ?   Head: Normocephalic and atraumatic.  ?   Right Ear: External ear normal.  ?   Left Ear: External ear normal.  ?Eyes:  ?   Conjunctiva/sclera: Conjunctivae normal.  ?   Pupils: Pupils are equal, round, and reactive to light.  ?Neck:  ?   Trachea: Phonation normal.  ?Cardiovascular:  ?   Rate and Rhythm: Normal rate and regular rhythm.   ?   Heart sounds: Normal heart sounds.  ?Pulmonary:  ?   Effort: Pulmonary effort is normal.  ?   Breath sounds: Normal breath sounds.  ?Abdominal:  ?   General: There is no distension.  ?   Palpations: Abdomen is soft.  ?   Tenderness: There is abdominal tenderness (Epigastric, mild). There is no rebound.  ?Musculoskeletal:     ?   General: Normal range of motion.  ?   Cervical back: Normal range of motion and neck supple.  ?Skin: ?   General: Skin is warm and dry.  ?Neurological:  ?   Mental Status: She is alert and oriented to person, place, and time.  ?   Cranial Nerves: No cranial nerve deficit.  ?   Sensory: No sensory deficit.  ?   Motor: No abnormal muscle tone.  ?   Coordination: Coordination normal.  ?Psychiatric:     ?   Mood and Affect: Mood normal.     ?   Behavior: Behavior normal.     ?   Thought Content: Thought content normal.     ?   Judgment: Judgment normal.  ? ? ?ED Results / Procedures / Treatments   ?Labs ?(all labs ordered are listed, but only abnormal results are displayed) ?Labs Reviewed  ?CBC WITH DIFFERENTIAL/PLATELET - Abnormal; Notable for the following components:  ?  Result Value  ? RBC 5.12 (*)   ? MCH 25.6 (*)   ? All other components within normal limits  ?URINALYSIS, ROUTINE W REFLEX MICROSCOPIC - Abnormal; Notable for the following components:  ? APPearance HAZY (*)   ? All other components within normal limits  ?COMPREHENSIVE METABOLIC PANEL  ?LIPASE, BLOOD  ?HCG, QUANTITATIVE, PREGNANCY  ?TROPONIN I (HIGH SENSITIVITY)  ?TROPONIN I (HIGH SENSITIVITY)  ? ? ?EKG ?EKG Interpretation ? ?Date/Time:  Wednesday October 21 2021 09:17:55 EST ?Ventricular Rate:  75 ?PR Interval:  134 ?QRS Duration: 90 ?QT Interval:  372 ?QTC Calculation: 415 ?R Axis:   66 ?Text Interpretation: Normal sinus rhythm Normal ECG When compared with ECG of 11-Jan-2020 21:50, PREVIOUS ECG IS PRESENT Since last tracing rate slower Otherwise no significant change Confirmed by Mancel Bale [phone]) on 10/21/2021  10:04:39 AM ? ?Radiology ?DG Chest 2 View ? ?Result Date: 10/21/2021 ?CLINICAL DATA:  Palpitations, epigastric pain EXAM: CHEST - 2 VIEW COMPARISON:  01/11/2020 FINDINGS: Normal heart size, mediastinal contours, and pulmonary vascularity. Lungs clear. No pleural effusion or pneumothorax. Bones unremarkable. IMPRESSION: Normal exam. Electronically Signed   By: Ulyses Southward M.D.   On: 10/21/2021 09:41   ? ?Procedures ?Procedures  ? ? ?Medications Ordered in ED ?Medications - No data to display ? ?ED Course/ Medical Decision Making/ A&P ?  ?                        ?Medical Decision Making ?Patient presenting with dyspepsia for a week without significant altered eating pattern.  No prior similar problems.  No other worrisome symptoms. ? ?Problems Addressed: ?Heartburn: acute illness or injury ?   Details: Short-term illness. ? ?Amount and/or Complexity of Data Reviewed ?Independent Historian:  ?   Details: She is a cogent historian ?Labs: ordered. ?   Details: CBC, metabolic panel, lipase, urine pregnancy, troponin-values are normal. ? ?Risk ?OTC drugs. ?Decision regarding hospitalization. ?Risk Details: Patient with dyspepsia, likely from heartburn, and unlikely to be from structural abnormality such as liver or gallbladder problems.  She is nontoxic in appearance and has reassuring vital signs and laboratory evaluation.  No indication for further ED evaluation, imaging or hospitalization at this time.  I have given recommendations for progressive treatment with antacids, gastric acid blockers, and proton pump inhibitors.  PCP follow-up is recommended if these do not help. ? ? ? ? ? ? ? ? ? ? ?Final Clinical Impression(s) / ED Diagnoses ?Final diagnoses:  ?Heartburn  ? ? ?Rx / DC Orders ?ED Discharge Orders   ? ? None  ? ?  ? ? ?  ?Mancel Bale, MD ?10/21/21 1110 ? ?

## 2021-10-21 NOTE — ED Provider Triage Note (Signed)
Emergency Medicine Provider Triage Evaluation Note ? ?Jiles Crocker , a 31 y.o. female  was evaluated in triage.  Pt complains of palpitations yesterday and burning epigastric pain intermittently for the past week. She denies any chest pain or SOB. Denies any nausea or vomiting.  ? ?Review of Systems  ?Positive: Palpitations, abdominal pain ?Negative: Fever, nausea, vomiting, chest pain, SOB ? ?Physical Exam  ?BP 125/75   Pulse 78   Temp 98.1 ?F (36.7 ?C) (Oral)   Resp 17   LMP 10/03/2021   SpO2 100%  ?Gen:   Awake, no distress   ?Resp:  Normal effort  ?MSK:   Moves extremities without difficulty  ?Other:  Faint surgical scars present on abdomen. Mild epigastric tenderness. Soft, non-distended. Normoactive BS. RRR.  ? ?Medical Decision Making  ?Medically screening exam initiated at 9:10 AM.  Appropriate orders placed.  Ece Cumberland was informed that the remainder of the evaluation will be completed by another provider, this initial triage assessment does not replace that evaluation, and the importance of remaining in the ED until their evaluation is complete. ? ?Labs ordered. ?  Achille Rich, PA-C ?10/21/21 0737 ? ?

## 2021-10-21 NOTE — ED Notes (Signed)
Pt placed on monitoring equipment ? ?

## 2021-10-21 NOTE — ED Triage Notes (Signed)
Translator/pt, yesterday I was walking and I could feel my heart beat in my throat and my stomach hurts for about a week. ?

## 2021-10-21 NOTE — Discharge Instructions (Addendum)
The testing today was normal.  You likely have heartburn causing your symptoms.  There are several medicines you can try.  First use an antacid such as Maalox, before meals and at bedtime.  If that does not work try famotidine, 20 mg twice a day for at least a week.  If the famotidine does not work, take omeprazole, 20 mg a day for 1 month.  See your doctor as needed for problems ?

## 2022-05-16 IMAGING — DX DG CHEST 2V
2 series · 2 of 2 positions shown · non-contrast
Comparison: 01/11/2020

CLINICAL DATA: Palpitations, epigastric pain

EXAM:
CHEST - 2 VIEW

[chest pa]
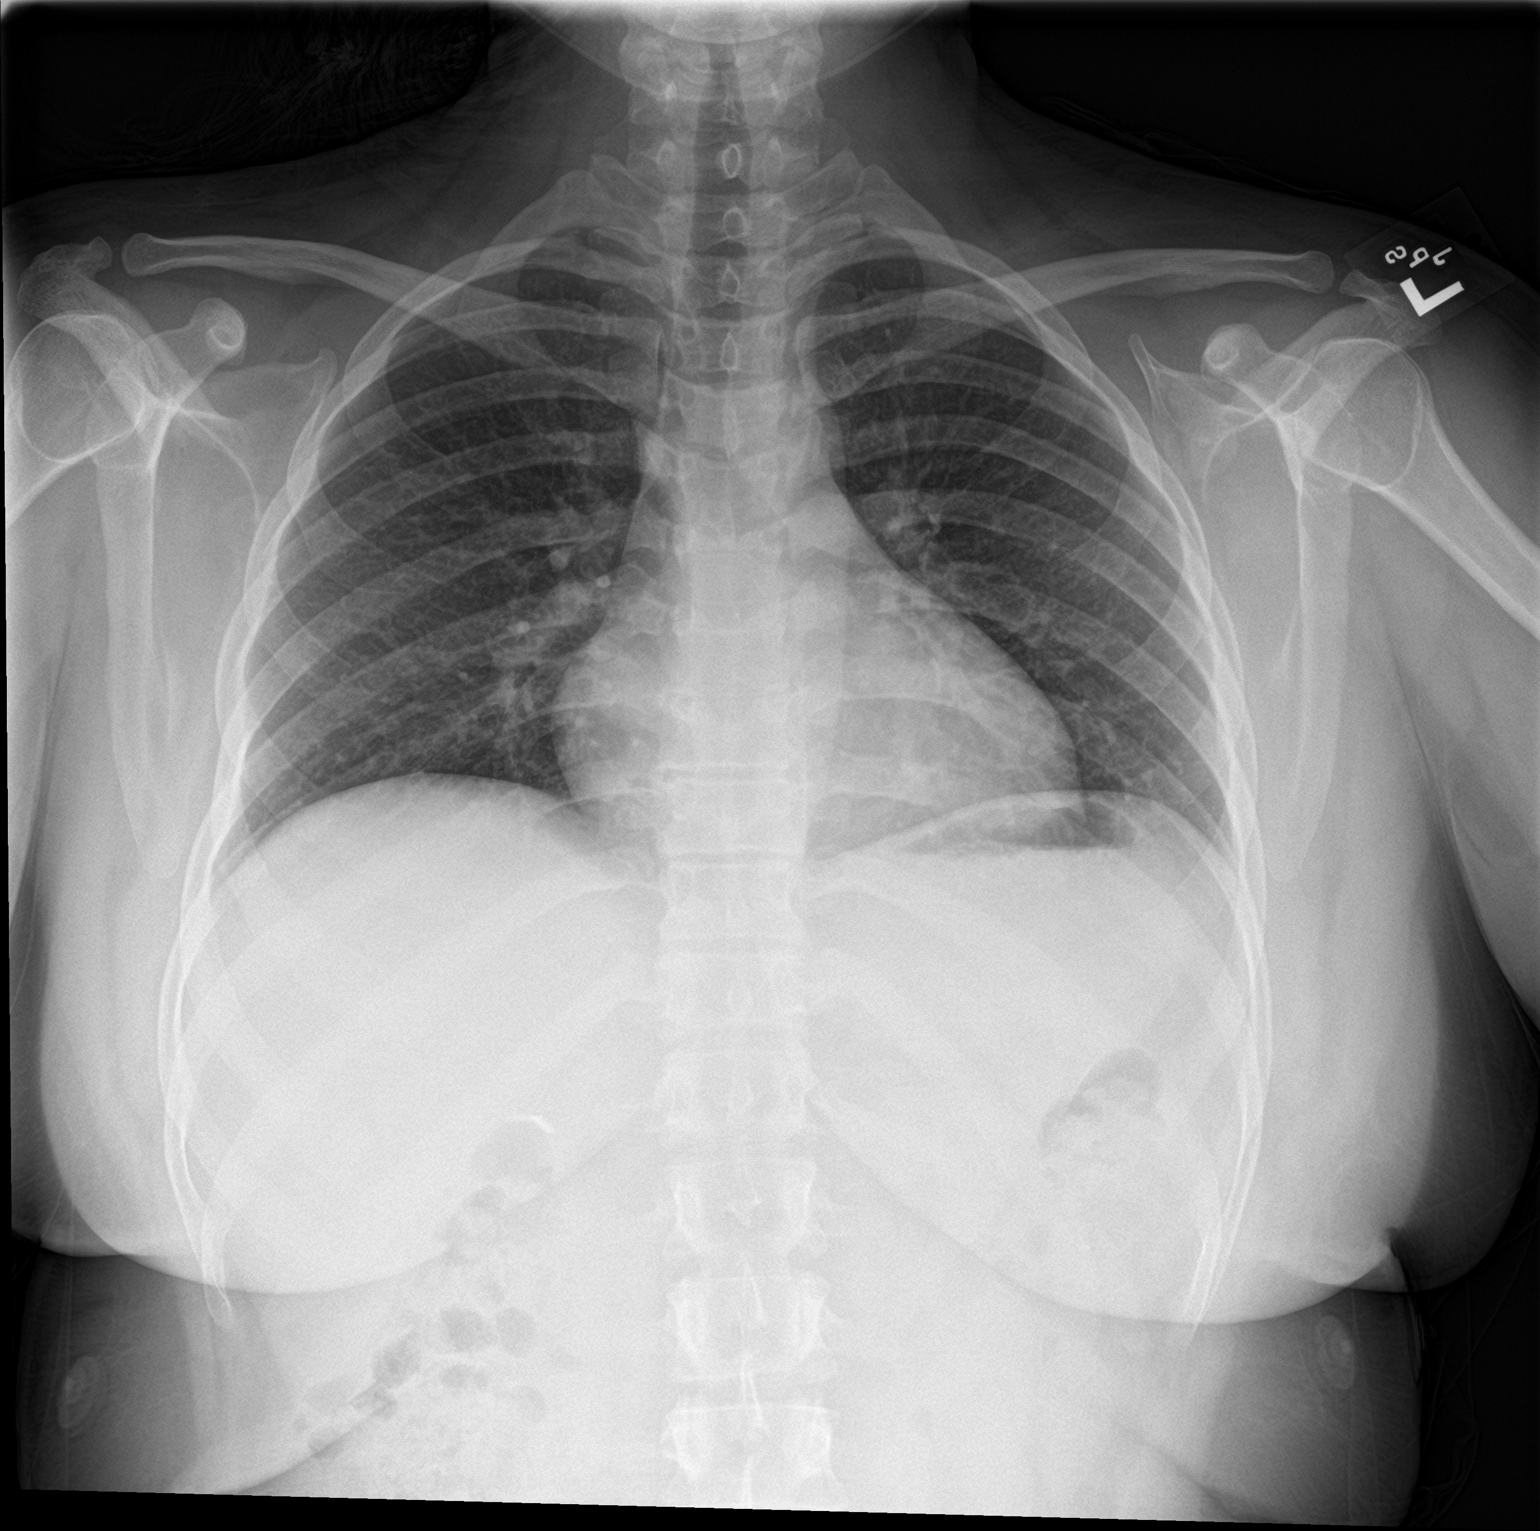

[chest lat]
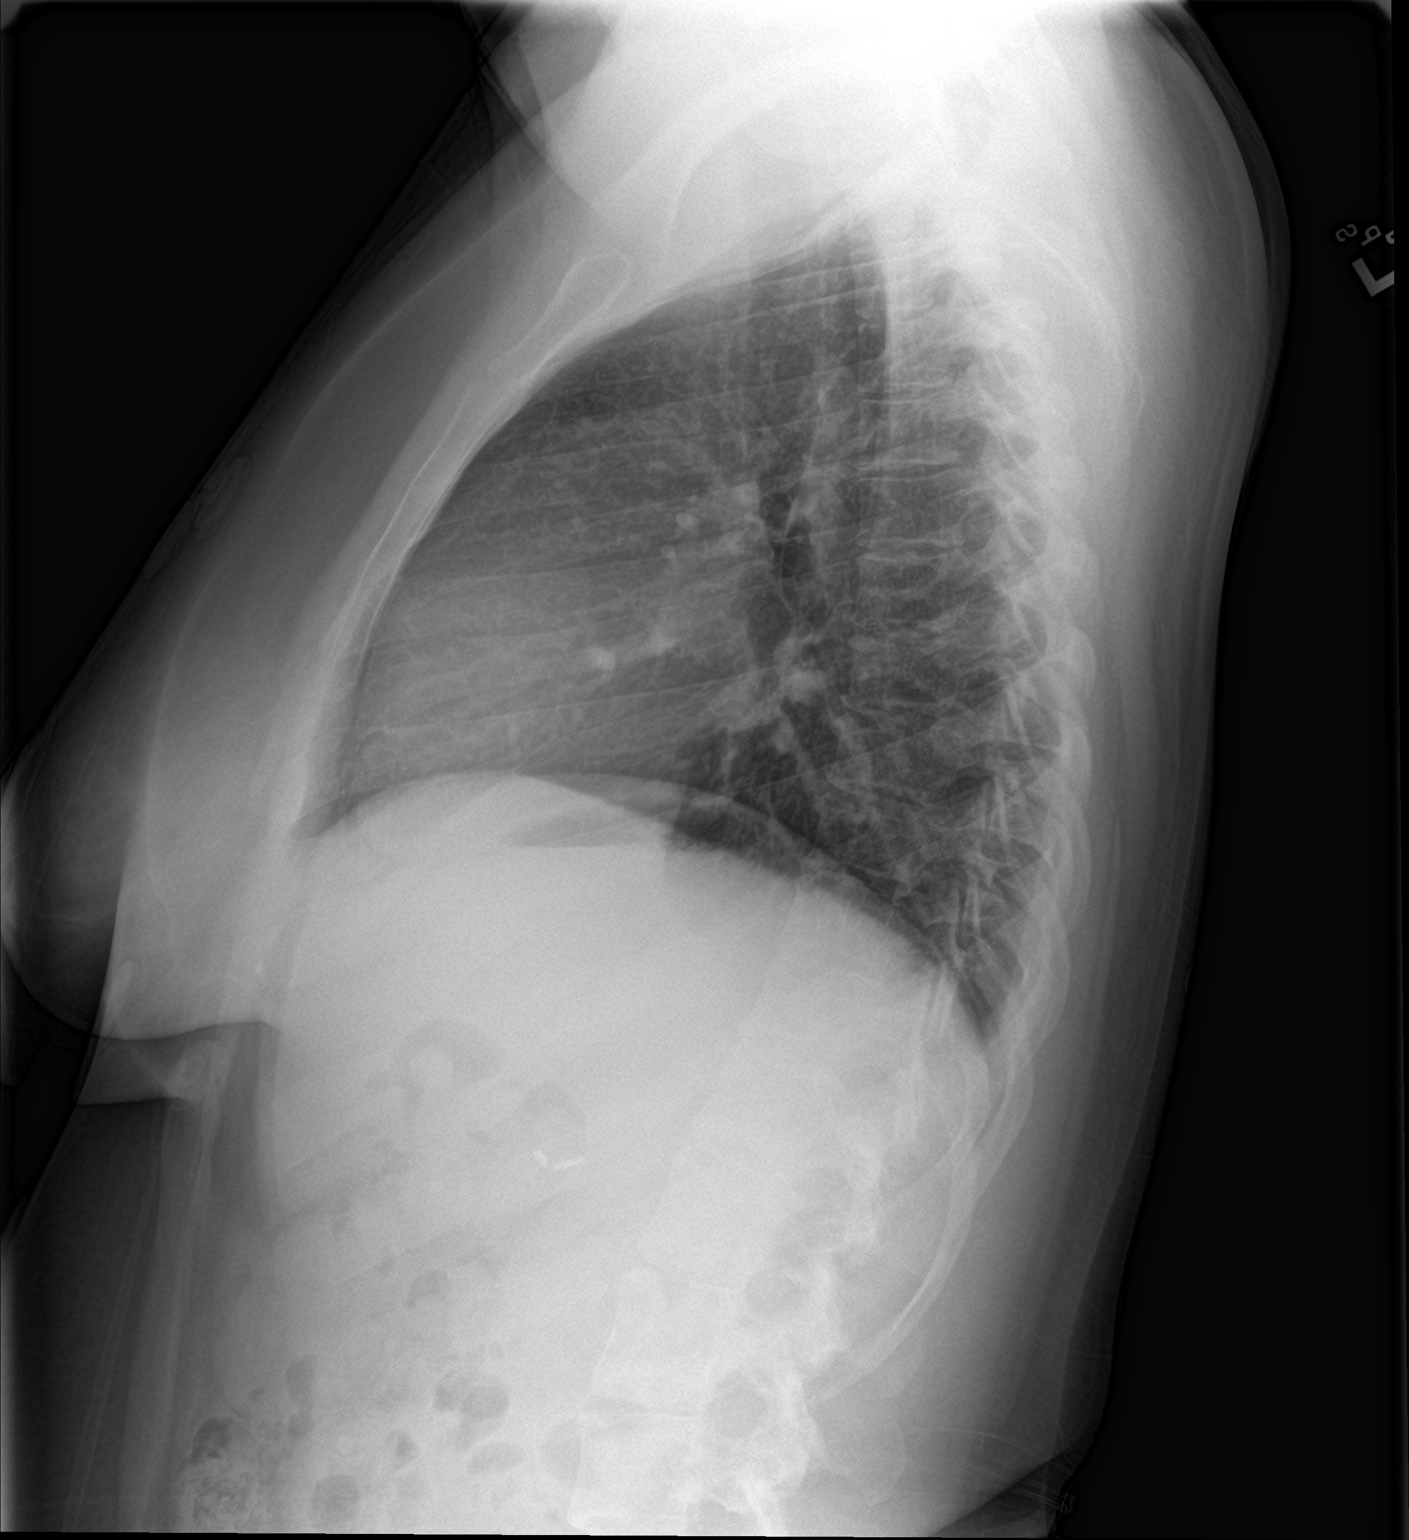

[2 of 2 positions shown; findings below may reference images not displayed]

FINDINGS: Normal heart size, mediastinal contours, and pulmonary vascularity.

Lungs clear.

No pleural effusion or pneumothorax.

Bones unremarkable.
IMPRESSION: Normal exam.

## 2023-01-15 ENCOUNTER — Other Ambulatory Visit: Payer: Self-pay

## 2023-01-15 ENCOUNTER — Emergency Department (HOSPITAL_COMMUNITY)
Admission: EM | Admit: 2023-01-15 | Discharge: 2023-01-15 | Disposition: A | Discharge status: Home / Self Care | Payer: No Typology Code available for payment source | Attending: Emergency Medicine | Admitting: Emergency Medicine

## 2023-01-15 ENCOUNTER — Emergency Department (HOSPITAL_COMMUNITY): Payer: No Typology Code available for payment source

## 2023-01-15 DIAGNOSIS — N189 Chronic kidney disease, unspecified: Secondary | ICD-10-CM | POA: Insufficient documentation

## 2023-01-15 DIAGNOSIS — R509 Fever, unspecified: Secondary | ICD-10-CM

## 2023-01-15 DIAGNOSIS — Z905 Acquired absence of kidney: Secondary | ICD-10-CM | POA: Insufficient documentation

## 2023-01-15 DIAGNOSIS — N12 Tubulo-interstitial nephritis, not specified as acute or chronic: Secondary | ICD-10-CM | POA: Insufficient documentation

## 2023-01-15 LAB — POC URINE PREG, ED: Preg Test, Ur: NEGATIVE

## 2023-01-15 LAB — URINALYSIS, ROUTINE W REFLEX MICROSCOPIC
Bilirubin Urine: NEGATIVE
Glucose, UA: NEGATIVE mg/dL
Ketones, ur: NEGATIVE mg/dL
Nitrite: NEGATIVE
Protein, ur: 100 mg/dL — AB
Specific Gravity, Urine: 1.009 (ref 1.005–1.030)
WBC, UA: >50 WBC/hpf (ref 0–5)
pH: 7 (ref 5.0–8.0)

## 2023-01-15 LAB — CBC
HCT: 39.7 % (ref 36.0–46.0)
Hemoglobin: 12.9 g/dL (ref 12.0–15.0)
MCH: 26.3 pg (ref 26.0–34.0)
MCHC: 32.5 g/dL (ref 30.0–36.0)
MCV: 81 fL (ref 80.0–100.0)
Platelets: 170 10*3/uL (ref 150–400)
RBC: 4.9 MIL/uL (ref 3.87–5.11)
RDW: 13.2 % (ref 11.5–15.5)
WBC: 11.4 10*3/uL — ABNORMAL HIGH (ref 4.0–10.5)
nRBC: 0 % (ref 0.0–0.2)

## 2023-01-15 LAB — COMPREHENSIVE METABOLIC PANEL
ALT: 18 U/L (ref 0–44)
AST: 23 U/L (ref 15–41)
Albumin: 3.5 g/dL (ref 3.5–5.0)
Alkaline Phosphatase: 84 U/L (ref 38–126)
Anion gap: 10 (ref 5–15)
BUN: 6 mg/dL (ref 6–20)
CO2: 22 mmol/L (ref 22–32)
Calcium: 9 mg/dL (ref 8.9–10.3)
Chloride: 103 mmol/L (ref 98–111)
Creatinine, Ser: 0.83 mg/dL (ref 0.44–1.00)
GFR, Estimated: >60 mL/min (ref >60)
Glucose, Bld: 118 mg/dL — ABNORMAL HIGH (ref 70–99)
Potassium: 3.5 mmol/L (ref 3.5–5.1)
Sodium: 135 mmol/L (ref 135–145)
Total Bilirubin: 1.7 mg/dL — ABNORMAL HIGH (ref 0.3–1.2)
Total Protein: 7.5 g/dL (ref 6.5–8.1)

## 2023-01-15 LAB — LACTIC ACID, PLASMA: Lactic Acid, Venous: 1.2 mmol/L (ref 0.5–1.9)

## 2023-01-15 LAB — I-STAT BETA HCG BLOOD, ED (MC, WL, AP ONLY): I-stat hCG, quantitative: <5 m[IU]/mL (ref <5)

## 2023-01-15 MED ORDER — ACETAMINOPHEN 325 MG PO TABS
650.0000 mg | ORAL_TABLET | Freq: Once | ORAL | Status: AC | PRN
  Administered 2023-01-15: 650 mg via ORAL

## 2023-01-15 MED ORDER — SODIUM CHLORIDE 0.9 % IV BOLUS
1000.0000 mL | Freq: Once | INTRAVENOUS | Status: AC
  Administered 2023-01-15: 1000 mL via INTRAVENOUS

## 2023-01-15 MED ORDER — SODIUM CHLORIDE 0.9 % IV SOLN
1.0000 g | Freq: Once | INTRAVENOUS | Status: AC
  Administered 2023-01-15: 1 g via INTRAVENOUS
  Filled 2023-01-15: qty 10

## 2023-01-15 MED ORDER — MORPHINE SULFATE (PF) 4 MG/ML IV SOLN
4.0000 mg | Freq: Once | INTRAVENOUS | Status: AC
  Administered 2023-01-15: 4 mg via INTRAVENOUS
  Filled 2023-01-15: qty 1

## 2023-01-15 MED ORDER — KETOROLAC TROMETHAMINE 15 MG/ML IJ SOLN
15.0000 mg | Freq: Once | INTRAMUSCULAR | Status: AC
  Administered 2023-01-15: 15 mg via INTRAVENOUS
  Filled 2023-01-15: qty 1

## 2023-01-15 MED ORDER — CEFDINIR 300 MG PO CAPS: 300.0000 mg | ORAL_CAPSULE | Freq: Two times a day (BID) | ORAL | 0 refills | Status: DC

## 2023-01-15 NOTE — ED Provider Notes (Signed)
EMERGENCY DEPARTMENT AT [facility] Provider Note   CSN: [id number] Arrival date & time: 01/15/23  1411     History  Chief Complaint  Patient presents with   Flank Pain   Dysuria    Natalie Armstrong is a 32 y.o. 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  Patient with fever, chills, tachycardia on arrival.  Last ibuprofen was yesterday.  She endorses burning with urination, reports that the pain in her flank, suprapubic region is worse with walking.  She reports her last menstrual cycle was normal for her.  She denies any vaginal discharge or abnormal bleeding.   Flank Pain  Dysuria Associated symptoms: flank pain        Home Medications Prior to Admission medications   Medication Sig Start Date End Date Taking? Authorizing Provider  cefdinir (OMNICEF) 300 MG capsule Take 1 capsule (300 mg total) by mouth 2 (two) times daily. 01/15/23  Yes Kendarius Vigen H, PA-C  albuterol (VENTOLIN HFA) 108 (90 Base) MCG/ACT inhaler Inhale 2 puffs into the lungs every 2 (two) hours as needed for wheezing or shortness of breath (cough). 01/12/20   Muthersbaugh, Dahlia Client, PA-C  levonorgestrel-ethinyl estradiol (NORDETTE) 0.15-30 MG-MCG tablet Take 1 tablet by mouth daily.    [provider]  promethazine-dextromethorphan (PROMETHAZINE-DM) 6.25-15 MG/5ML syrup Take 5 mLs by mouth 4 (four) times daily as needed for cough. 01/12/20   Muthersbaugh, Dahlia Client, PA-C      Allergies    Patient has no known allergies.    Review of Systems   Review of Systems  Genitourinary:  Positive for dysuria and flank pain.  All other systems reviewed and are negative.   Physical Exam Updated Vital Signs BP 112/77   Pulse (!) 111   Temp 98.6 F (37 C) (Oral)   Resp 16   SpO2 98%  Physical Exam Vitals and nursing note  reviewed.  Constitutional:      General: She is not in acute distress.    Appearance: Normal appearance. She is ill-appearing.  HENT:     Head: Normocephalic and atraumatic.  Eyes:     General:        Right eye: No discharge.        Left eye: No discharge.  Cardiovascular:     Rate and Rhythm: Regular rhythm. Tachycardia present.     Heart sounds: No murmur heard.    No friction rub. No gallop.  Pulmonary:     Effort: Pulmonary effort is normal.     Breath sounds: Normal breath sounds.  Abdominal:     General: Bowel sounds are normal.     Palpations: Abdomen is soft.     Comments: Some tenderness of the abdomen suprapubically with radiation towards the left flank.  No rebound, rigidity, guarding, no CVA tenderness on my exam.  Skin:    General: Skin is warm and dry.     Capillary Refill: Capillary refill takes less than 2 seconds.     Comments: Warm, diaphoretic  Neurological:     Mental Status: She is alert and oriented to person, place, and time.  Psychiatric:        Mood and Affect: Mood normal.        Behavior: Behavior normal.     ED Results / Procedures / Treatments   Labs (all labs ordered are listed, but  only abnormal results are displayed) Labs Reviewed  COMPREHENSIVE METABOLIC PANEL - Abnormal; Notable for the following components:      Result Value   Glucose, Bld 118 (*)    Total Bilirubin 1.7 (*)    All other components within normal limits  CBC - Abnormal; Notable for the following components:   WBC 11.4 (*)    All other components within normal limits  URINALYSIS, ROUTINE W REFLEX MICROSCOPIC - Abnormal; Notable for the following components:   APPearance CLOUDY (*)    Hgb urine dipstick MODERATE (*)    Protein, ur 100 (*)    Leukocytes,Ua MODERATE (*)    Bacteria, UA RARE (*)    All other components within normal limits  CULTURE, BLOOD (ROUTINE X 2)  CULTURE, BLOOD (ROUTINE X 2)  LACTIC ACID, PLASMA  LACTIC ACID, PLASMA  POC URINE PREG, ED   I-STAT BETA HCG BLOOD, ED ([facility], [facility], [facility] ONLY)    EKG None  Radiology CT Renal Stone Study  Result Date: 01/15/2023 CLINICAL DATA:  Abdominal pain. EXAM: CT ABDOMEN AND PELVIS WITHOUT CONTRAST TECHNIQUE: Multidetector CT imaging of the abdomen and pelvis was performed following the standard protocol without IV contrast. RADIATION DOSE REDUCTION: This exam was performed according to the departmental dose-optimization program which includes automated exposure control, adjustment of the mA and/or kV according to patient size and/or use of iterative reconstruction technique. COMPARISON:  March 19, 2018 FINDINGS: Lower chest: No acute abnormality. Hepatobiliary: No focal liver abnormality is seen. Status post cholecystectomy. No biliary dilatation. Pancreas: Unremarkable. No pancreatic ductal dilatation or surrounding inflammatory changes. Spleen: Normal in size without focal abnormality. Adrenals/Urinary Tract: Absent right kidney. Normal adrenal glands, left kidney and left ureter. Normal urinary bladder. Stomach/Bowel: Stomach is within normal limits. Appendix appears normal. No evidence of bowel wall thickening, distention, or inflammatory changes. Vascular/Lymphatic: No significant vascular findings are present. No enlarged abdominal or pelvic lymph nodes. Reproductive: 3 cm left adnexal mass measuring low-density. Other: No abdominal wall hernia or abnormality. No abdominopelvic ascites. Musculoskeletal: No acute or significant osseous findings. IMPRESSION: 1. No acute abnormality identified within the abdomen or pelvis. 2. Absent right kidney. 3. 3 cm left adnexal mass measuring low-density, likely a cyst. Further evaluation with pelvic ultrasound may be considered. Electronically Signed   By: Ted Mcalpine M.D.   On: 01/15/2023 16:39    Procedures Procedures    Medications Ordered in ED Medications  sodium chloride 0.9 % bolus 1,000 mL (has no administration in time range)  morphine (PF) 4  MG/ML injection 4 mg (has no administration in time range)  acetaminophen (TYLENOL) tablet 650 mg (650 mg Oral Given 01/15/23 1430)  sodium chloride 0.9 % bolus 1,000 mL (0 mLs Intravenous Stopped 01/15/23 1700)  ketorolac (TORADOL) 15 MG/ML injection 15 mg (15 mg Intravenous Given 01/15/23 1525)  cefTRIAXone (ROCEPHIN) 1 g in sodium chloride 0.9 % 100 mL IVPB (1 g Intravenous New Bag/Given 01/15/23 1720)    ED Course/ Medical Decision Making/ A&P                             Medical Decision Making Amount and/or Complexity of Data Reviewed Labs: ordered. Radiology: ordered.  Risk OTC drugs. Prescription drug management.   This patient is a 33 y.o. female  who presents to the ED for concern of flank pain, fever, dysuria.   Differential diagnoses prior to evaluation: The emergent differential diagnosis includes, but is not limited  to,  AAA, renal artery/vein embolism/thrombosis, mesenteric ischemia, pyelonephritis, nephrolithiasis, cystitis, biliary colic, pancreatitis, perforated peptic ulcer, appendicitis, diverticulitis, bowel obstruction, Ectopic Pregnancy, PID/TOA, Ovarian cyst, Ovarian torsion . This is not an exhaustive differential.   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  No previous history of kidney stones.  Physical Exam: Physical exam performed. The pertinent findings include: Patient with significant tenderness in the left flank, extending towards the region, no rebound or rigidity, guarding, she is febrile with tachycardia on arrival.  SIRS vitals.  Lab Tests/Imaging studies: I personally interpreted labs/imaging and the pertinent results include: UA with clear UTI with moderate leukocytes, greater than 50 white blood cells and bacteria as well as white blood cell clumps.  CMP is overall unremarkable, notably with no elevation of creatinine, total bilirubin is  elevated 1.7 which is likely secondary to dehydration, she has no right upper quadrant tenderness to suggest a gallbladder pathology.  CBC with mild leukocytosis, white blood cells 11.4..  CT renal stone study shows solitary kidney but no evidence of obstruction, stone, or severe hydronephrosis I agree with the radiologist interpretation.   Medications: I ordered medication including fluids, rocephin, tylenol -- pain, tachycardia, fever improved significantly.  I have reviewed the patients home medicines and have made adjustments as needed.   Disposition: After consideration of the diagnostic results and the patients response to treatment, I feel that patient is stable for discharge at this time .   emergency department workup does not suggest an emergent condition requiring admission or immediate intervention beyond what has been performed at this time. The plan is: as above. The patient is safe for discharge and has been instructed to return immediately for worsening symptoms, change in symptoms or any other concerns.  Final Clinical Impression(s) / ED Diagnoses Final diagnoses:  Pyelonephritis  Fever, unspecified fever cause    Rx / DC Orders ED Discharge Orders          Ordered    cefdinir (OMNICEF) 300 MG capsule  2 times daily        01/15/23 1753              Antoine Vandermeulen, Edyth Gunnels 01/15/23 Jossie Ng, MD 01/15/23 2002

## 2023-01-15 NOTE — Discharge Instructions (Addendum)
Utilice Tylenol o ibuprofeno para el dolor y la fiebre.   Puede utilizar 600 mg de ibuprofeno cada 6 horas o 1000 mg de Tylenol cada 6 horas.   Puede optar por [employer] 2. Esto sera ms efectivo.   No exceder los 4 g de Tylenol en 24 horas.   No exceder los 3200 mg de ibuprofeno las 24 horas. El medicamento de venta libre AZO tambin puede ayudar con el dolor especfico de la infeccin del tracto urinario.   Tome todo el tratamiento con antibiticos que le recet y Clinical biochemist un seguimiento con su mdico de atencin primaria para asegurarse de que sus sntomas se estn resolviendo.   Si sus sntomas empeoran a pesar del tratamiento, le recomiendo regresar al departamento de emergencias para una evaluacin y [place] adicionales.

## 2023-01-15 NOTE — ED Triage Notes (Signed)
Per interpreter: Pt with dysuria onset Thursday. Yesterday developed fever and L flank pain. Pt febrile in triage. Pt last took Ibuprofen yesterday.

## 2023-01-16 ENCOUNTER — Other Ambulatory Visit: Payer: Self-pay

## 2023-01-16 ENCOUNTER — Inpatient Hospital Stay (HOSPITAL_COMMUNITY)
Admission: EM | Admit: 2023-01-16 | Discharge: 2023-01-18 | DRG: 690 | Disposition: A | Discharge status: Home / Self Care | Payer: No Typology Code available for payment source | Attending: Internal Medicine | Admitting: Internal Medicine

## 2023-01-16 ENCOUNTER — Encounter (HOSPITAL_COMMUNITY): Payer: Self-pay | Admitting: *Deleted

## 2023-01-16 DIAGNOSIS — N10 Acute tubulo-interstitial nephritis: Principal | ICD-10-CM | POA: Diagnosis present

## 2023-01-16 DIAGNOSIS — E669 Obesity, unspecified: Secondary | ICD-10-CM | POA: Diagnosis present

## 2023-01-16 DIAGNOSIS — N12 Tubulo-interstitial nephritis, not specified as acute or chronic: Principal | ICD-10-CM

## 2023-01-16 DIAGNOSIS — B9620 Unspecified Escherichia coli [E. coli] as the cause of diseases classified elsewhere: Secondary | ICD-10-CM | POA: Diagnosis present

## 2023-01-16 DIAGNOSIS — Z6830 Body mass index (BMI) 30.0-30.9, adult: Secondary | ICD-10-CM

## 2023-01-16 DIAGNOSIS — R17 Unspecified jaundice: Secondary | ICD-10-CM | POA: Diagnosis present

## 2023-01-16 DIAGNOSIS — Z8249 Family history of ischemic heart disease and other diseases of the circulatory system: Secondary | ICD-10-CM

## 2023-01-16 DIAGNOSIS — Z1611 Resistance to penicillins: Secondary | ICD-10-CM | POA: Diagnosis present

## 2023-01-16 DIAGNOSIS — Z905 Acquired absence of kidney: Secondary | ICD-10-CM

## 2023-01-16 DIAGNOSIS — R7881 Bacteremia: Secondary | ICD-10-CM | POA: Diagnosis present

## 2023-01-16 DIAGNOSIS — N390 Urinary tract infection, site not specified: Secondary | ICD-10-CM

## 2023-01-16 DIAGNOSIS — R319 Hematuria, unspecified: Secondary | ICD-10-CM | POA: Diagnosis present

## 2023-01-16 DIAGNOSIS — D696 Thrombocytopenia, unspecified: Secondary | ICD-10-CM | POA: Diagnosis present

## 2023-01-16 LAB — BLOOD CULTURE ID PANEL (REFLEXED) - BCID2

## 2023-01-16 LAB — COMPREHENSIVE METABOLIC PANEL
ALT: 14 U/L (ref 0–44)
AST: 15 U/L (ref 15–41)
Albumin: 2.7 g/dL — ABNORMAL LOW (ref 3.5–5.0)
Alkaline Phosphatase: 72 U/L (ref 38–126)
Anion gap: 7 (ref 5–15)
BUN: 7 mg/dL (ref 6–20)
CO2: 23 mmol/L (ref 22–32)
Calcium: 7.7 mg/dL — ABNORMAL LOW (ref 8.9–10.3)
Chloride: 107 mmol/L (ref 98–111)
Creatinine, Ser: 0.78 mg/dL (ref 0.44–1.00)
GFR, Estimated: >60 mL/min (ref >60)
Glucose, Bld: 103 mg/dL — ABNORMAL HIGH (ref 70–99)
Potassium: 3.1 mmol/L — ABNORMAL LOW (ref 3.5–5.1)
Sodium: 137 mmol/L (ref 135–145)
Total Bilirubin: 1.4 mg/dL — ABNORMAL HIGH (ref 0.3–1.2)
Total Protein: 6.3 g/dL — ABNORMAL LOW (ref 6.5–8.1)

## 2023-01-16 LAB — TECHNOLOGIST SMEAR REVIEW: Plt Morphology: NORMAL

## 2023-01-16 LAB — CBC WITH DIFFERENTIAL/PLATELET
Abs Immature Granulocytes: 0.07 10*3/uL (ref 0.00–0.07)
Basophils Absolute: 0.1 10*3/uL (ref 0.0–0.1)
Basophils Relative: 0 %
Eosinophils Absolute: 0.1 10*3/uL (ref 0.0–0.5)
Eosinophils Relative: 1 %
HCT: 35.4 % — ABNORMAL LOW (ref 36.0–46.0)
Hemoglobin: 11.2 g/dL — ABNORMAL LOW (ref 12.0–15.0)
Immature Granulocytes: 1 %
Lymphocytes Relative: 13 %
Lymphs Abs: 1.8 10*3/uL (ref 0.7–4.0)
MCH: 25.9 pg — ABNORMAL LOW (ref 26.0–34.0)
MCHC: 31.6 g/dL (ref 30.0–36.0)
MCV: 81.9 fL (ref 80.0–100.0)
Monocytes Absolute: 0.6 10*3/uL (ref 0.1–1.0)
Monocytes Relative: 4 %
Neutro Abs: 11.3 10*3/uL — ABNORMAL HIGH (ref 1.7–7.7)
Neutrophils Relative %: 81 %
Platelets: 139 10*3/uL — ABNORMAL LOW (ref 150–400)
RBC: 4.32 MIL/uL (ref 3.87–5.11)
RDW: 13.4 % (ref 11.5–15.5)
WBC: 13.9 10*3/uL — ABNORMAL HIGH (ref 4.0–10.5)
nRBC: 0 % (ref 0.0–0.2)

## 2023-01-16 LAB — URINALYSIS, ROUTINE W REFLEX MICROSCOPIC
Bilirubin Urine: NEGATIVE
Glucose, UA: NEGATIVE mg/dL
Ketones, ur: 20 mg/dL — AB
Nitrite: NEGATIVE
Protein, ur: NEGATIVE mg/dL
Specific Gravity, Urine: 1.006 (ref 1.005–1.030)
pH: 7 (ref 5.0–8.0)

## 2023-01-16 LAB — LACTIC ACID, PLASMA
Lactic Acid, Venous: 0.8 mmol/L (ref 0.5–1.9)
Lactic Acid, Venous: 1.3 mmol/L (ref 0.5–1.9)

## 2023-01-16 MED ORDER — LACTATED RINGERS IV BOLUS (SEPSIS)
1000.0000 mL | Freq: Once | INTRAVENOUS | Status: AC
  Administered 2023-01-16: 1000 mL via INTRAVENOUS

## 2023-01-16 MED ORDER — SODIUM CHLORIDE 0.9 % IV SOLN
2.0000 g | Freq: Once | INTRAVENOUS | Status: AC
  Administered 2023-01-16: 2 g via INTRAVENOUS
  Filled 2023-01-16: qty 20

## 2023-01-16 MED ORDER — SODIUM CHLORIDE 0.9 % IV SOLN: 2.0000 g | Freq: Once | INTRAVENOUS | Status: DC

## 2023-01-16 MED ORDER — SODIUM CHLORIDE 0.9 % IV SOLN
2.0000 g | INTRAVENOUS | Status: DC
  Administered 2023-01-17 – 2023-01-18 (×2): 2 g via INTRAVENOUS
  Filled 2023-01-16 (×2): qty 20

## 2023-01-16 MED ORDER — RIVAROXABAN 10 MG PO TABS
10.0000 mg | ORAL_TABLET | Freq: Every day | ORAL | Status: DC
  Administered 2023-01-16 – 2023-01-18 (×3): 10 mg via ORAL
  Filled 2023-01-16 (×3): qty 1

## 2023-01-16 NOTE — Progress Notes (Signed)
Patient arrived to the unit via stretcher from ED.  A/O x4. No pain at this time. VSS. Oriented patient to the room and staff. Education provided regarding safety precaution and patient verbalize understanding.

## 2023-01-16 NOTE — ED Provider Notes (Signed)
[place] [facility] DE[facility]MENT AT [facility] Provider Note   CSN: [id number] Arrival date & time: 01/16/23  4098     History  Chief Complaint  Patient presents with   Abnormal Lab    Natalie Armstrong is a 32 y.o. female.   Abnormal Lab Patient presents for positive blood cultures.  Medical history includes anemia, CKD, and solitary kidney.  She states that she had an infection when she was 32 years old that required nephrectomy.  She was seen in the emergency department yesterday for flank pain and dysuria.  At that time, she was treated for pyelonephritis with IV fluids, Tylenol, and Rocephin.  She has not taken any antibiotics since her ED visit.  She has not taken any antipyretic medications today.  She has had ongoing left flank pain, currently 5/10 in severity.  She denies any current nausea.  Patient was informed that she had a positive blood culture and was advised to return to the ED.  Per chart review, blood culture from yesterday grew E. coli and Enterobacterales after 14 hours.     Home Medications Prior to Admission medications   Medication Sig Start Date End Date Taking? Authorizing Provider  albuterol (VENTOLIN HFA) 108 (90 Base) MCG/ACT inhaler Inhale 2 puffs into the lungs every 2 (two) hours as needed for wheezing or shortness of breath (cough). 01/12/20   Muthersbaugh, Dahlia Client, PA-C  cefdinir (OMNICEF) 300 MG capsule Take 1 capsule (300 mg total) by mouth 2 (two) times daily. 01/15/23   Prosperi, Christian H, PA-C  levonorgestrel-ethinyl estradiol (NORDETTE) 0.15-30 MG-MCG tablet Take 1 tablet by mouth daily.    [provider]  promethazine-dextromethorphan (PROMETHAZINE-DM) 6.25-15 MG/5ML syrup Take 5 mLs by mouth 4 (four) times daily as needed for cough. 01/12/20   Muthersbaugh, Dahlia Client, PA-C      Allergies    Patient has no known allergies.    Review of Systems   Review of Systems  Constitutional:  Positive for fever.  Genitourinary:   Positive for dysuria and flank pain.  All other systems reviewed and are negative.   Physical Exam Updated Vital Signs BP 131/86 (BP Location: Right Arm)   Pulse 94   Temp 98.1 F (36.7 C) (Oral)   Resp 18   Ht 5\' 4"  (1.626 m)   Wt 79.8 kg   SpO2 100%   BMI 30.20 kg/m  Physical Exam Vitals and nursing note reviewed.  Constitutional:      General: She is not in acute distress.    Appearance: Normal appearance. She is well-developed. She is not ill-appearing, toxic-appearing or diaphoretic.  HENT:     Head: Normocephalic and atraumatic.     Right Ear: External ear normal.     Left Ear: External ear normal.     Nose: Nose normal.     Mouth/Throat:     Mouth: Mucous membranes are moist.  Eyes:     Extraocular Movements: Extraocular movements intact.     Conjunctiva/sclera: Conjunctivae normal.  Cardiovascular:     Rate and Rhythm: Normal rate and regular rhythm.  Pulmonary:     Effort: Pulmonary effort is normal. No respiratory distress.  Abdominal:     General: There is no distension.     Palpations: Abdomen is soft.     Tenderness: There is abdominal tenderness. There is left CVA tenderness. There is no right CVA tenderness, guarding or rebound.  Musculoskeletal:        General: No swelling. Normal range of motion.  Cervical back: Normal range of motion and neck supple.     Right lower leg: No edema.     Left lower leg: No edema.  Skin:    General: Skin is warm and dry.     Capillary Refill: Capillary refill takes less than 2 seconds.     Coloration: Skin is not jaundiced or pale.  Neurological:     General: No focal deficit present.     Mental Status: She is alert and oriented to person, place, and time.     Cranial Nerves: No cranial nerve deficit.     Sensory: No sensory deficit.     Motor: No weakness.     Coordination: Coordination normal.  Psychiatric:        Mood and Affect: Mood normal.        Behavior: Behavior normal.        Thought Content:  Thought content normal.        Judgment: Judgment normal.     ED Results / Procedures / Treatments   Labs (all labs ordered are listed, but only abnormal results are displayed) Labs Reviewed  CULTURE, BLOOD (ROUTINE X 2)  CULTURE, BLOOD (ROUTINE X 2)  URINE CULTURE  LACTIC ACID, PLASMA  LACTIC ACID, PLASMA  COMPREHENSIVE METABOLIC PANEL  CBC WITH DIFFERENTIAL/PLATELET  URINALYSIS, ROUTINE W REFLEX MICROSCOPIC    EKG None  Radiology CT Renal Stone Study  Result Date: 01/15/2023 CLINICAL DATA:  Abdominal pain. EXAM: CT ABDOMEN AND PELVIS WITHOUT CONTRAST TECHNIQUE: Multidetector CT imaging of the abdomen and pelvis was performed following the standard protocol without IV contrast. RADIATION DOSE REDUCTION: This exam was performed according to the departmental dose-optimization program which includes automated exposure control, adjustment of the mA and/or kV according to patient size and/or use of iterative reconstruction technique. COMPARISON:  March 19, 2018 FINDINGS: Lower chest: No acute abnormality. Hepatobiliary: No focal liver abnormality is seen. Status post cholecystectomy. No biliary dilatation. Pancreas: Unremarkable. No pancreatic ductal dilatation or surrounding inflammatory changes. Spleen: Normal in size without focal abnormality. Adrenals/Urinary Tract: Absent right kidney. Normal adrenal glands, left kidney and left ureter. Normal urinary bladder. Stomach/Bowel: Stomach is within normal limits. Appendix appears normal. No evidence of bowel wall thickening, distention, or inflammatory changes. Vascular/Lymphatic: No significant vascular findings are present. No enlarged abdominal or pelvic lymph nodes. Reproductive: 3 cm left adnexal mass measuring low-density. Other: No abdominal wall hernia or abnormality. No abdominopelvic ascites. Musculoskeletal: No acute or significant osseous findings. IMPRESSION: 1. No acute abnormality identified within the abdomen or pelvis. 2. Absent  right kidney. 3. 3 cm left adnexal mass measuring low-density, likely a cyst. Further evaluation with pelvic ultrasound may be considered. Electronically Signed   By: Ted Mcalpine M.D.   On: 01/15/2023 16:39    Procedures Procedures    Medications Ordered in ED Medications  lactated ringers bolus 1,000 mL (1,000 mLs Intravenous New Bag/Given 01/16/23 0733)  cefTRIAXone (ROCEPHIN) 2 g in sodium chloride 0.9 % 100 mL IVPB (2 g Intravenous New Bag/Given 01/16/23 1610)    ED Course/ Medical Decision Making/ A&P                             Medical Decision Making Amount and/or Complexity of Data Reviewed Labs: ordered.  Risk Decision regarding hospitalization.   This patient presents to the ED for concern of recent positive blood cultures, this involves an extensive number of treatment options, and is a complaint  that carries with it a high risk of complications and morbidity.  The differential diagnosis includes bacteremia, contaminated sample, laboratory error   Co morbidities that complicate the patient evaluation  Solitary kidney, CKD, anemia   Additional history obtained:  Additional history obtained from N/A External records from outside source obtained and reviewed including EMR   Lab Tests:  Reviewed lab work from yesterday which showed a mild leukocytosis, normal kidney function, normal lactate.  Blood cultures from yesterday were positive, as detailed above.   Imaging Studies ordered:  Reviewed CT imaging from yesterday which showed no obstructive uropathy.   Cardiac Monitoring: / EKG:  The patient was maintained on a cardiac monitor.  I personally viewed and interpreted the cardiac monitored which showed an underlying rhythm of: Sinus rhythm   Problem List / ED Course / Critical interventions / Medication management  Patient presents for positive blood cultures, drawn yesterday when she was seen in the ED for flank pain and dysuria and treated for  pyelonephritis.  She received ceftriaxone, 1 g, yesterday.  She has not taken any antibiotics since then.  She has had no worsening of symptoms.  She continues to have left sided flank pain, currently 5/10 in severity.  On exam, she does have some mild tenderness to left side of abdomen as well as left CVA.  She denies any current nausea.  Vital signs are reassuring on her arrival in the ED today.  On review of EMR, blood cultures from yesterday, drawn at 3:41 PM, grew E. coli and Enterobacterales this morning.  Repeat lab work was ordered.  IV fluids and ceftriaxone was ordered.  Patient declines any pain medication at this time.  She is agreeable to admission.  Patient was admitted to medicine for further management. I ordered medication including IV fluids for hydration; ceftriaxone for pyelonephritis/bacteremia Reevaluation of the patient after these medicines showed that the patient stayed the same I have reviewed the patients home medicines and have made adjustments as needed   Social Determinants of Health:  Spanish-speaking, has PCP         Final Clinical Impression(s) / ED Diagnoses Final diagnoses:  Pyelonephritis  Bacteremia    Rx / DC Orders ED Discharge Orders     None         Gloris Manchester, MD 01/16/23 [phone]

## 2023-01-16 NOTE — H&P (Cosign Needed)
Date: 01/16/2023               Patient Name:  Natalie Armstrong MRN: [id number]  DOB: 1991-08-23 Age / Sex: 32 y.o., female   PCP: Grayce Sessions, NP         Medical Service: Internal Medicine Teaching Service         Attending Physician: Dr. Mayford Knife Dorene Ar, MD      First Contact: Dr. Lajuana Ripple, MD Pager [phone]    Second Contact: Dr. Rudene Christians, DO Pager [phone]         After Hours (After 5p/  First Contact Pager: [phone]  weekends / holidays): Second Contact Pager: [phone]   SUBJECTIVE  Chief Complaint: left sided flank pain, fever  History of Present Illness: Natalie Armstrong is a 32 y.o. female with a pertinent PMH of right nephrectomy, who presents to [facility] with flank pain and fevers.  She reports that this started on Thursday 5/23 with her left side hurting and some fatigue. This became worse so she presented to the ED on Saturday. In the ED, she was febrile to 102 F. Her work-up including UA, CT renal U/S was remarkable for pyelonephritis without abscess. She was tolerating fluid.  She was given a dose of ceftriaxone then was discharged home with cefdinir to finish course for acute complicated UTI. Blood cultures grew E coli in 2/2 bottles so she was called and asked to return to hospital.  She reports increased urinary frequency, but has not had burning with urination. She has never been treated for a bladder infection in the past. She did have a bad kidney infection in childhood leading to her right kidney being removed. She does not feel like she has had additional fevers or chills since leaving the hospital yesterday.  Medications: Levonogestrel-ethinyl estradiol  Past Medical History:  History of right nephrectomy  Social:  Lives - husband and 3 children who are 6, 12, and 65.   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   OBJECTIVE:  Physical Exam: Blood pressure 115/80, pulse 88, temperature 97.8 F (36.6 C), temperature source Oral, resp. rate (!) 22, height 5\' 4"  (1.626 m), weight 79.8 kg, SpO2 100 %. Constitutional: well-appearing, in no acute distress Cardiovascular: regular rate and rhythm, no m/r/g Pulmonary/Chest: normal work of breathing on room air, lungs clear to auscultation bilaterally Abdominal: soft, non-tender, non-distended, no suprapubic tenderness MSK: tenderness present to left flank, no CVA tenderness Skin: warm and dry  Pertinent Labs: WBC 11.4 to 13.9 with 81% neutrophilic predominance Lactic acid 1.3 Blood culture 5/25 with e coli in 2/2 bottles  hCG negative  CMP with Tbili 1.7  UA- pyuria, moderate hgb w RBCs, proteinuria, moderate leukocytes and rare bacteria  Pertinent Imaging: CT renal study FINDINGS: Lower chest: No acute abnormality. Hepatobiliary: No focal liver abnormality is seen. Status post cholecystectomy. No biliary dilatation. Pancreas: Unremarkable. No pancreatic ductal dilatation or surrounding inflammatory changes. Spleen: Normal in size without focal abnormality. Adrenals/Urinary Tract: Absent right kidney. Normal adrenal glands, left kidney and left ureter. Normal urinary bladder. Stomach/Bowel: Stomach is within normal  limits. Appendix appears normal. No evidence of bowel wall thickening, distention, or inflammatory changes. Vascular/Lymphatic: No significant vascular findings are present. No enlarged abdominal or pelvic lymph nodes. Reproductive: 3 cm left adnexal mass measuring low-density. Other: No abdominal wall hernia or abnormality. No abdominopelvic ascites. Musculoskeletal: No acute or significant osseous  findings. IMPRESSION: 1. No acute abnormality identified within the abdomen or pelvis. 2. Absent right kidney. 3. 3 cm left adnexal mass measuring low-density, likely a cyst. Further evaluation with pelvic ultrasound may be considered.  ASSESSMENT & PLAN:  Assessment: Principal Problem:   Bacteremia due to Gram-negative bacteria   Natalie Armstrong is a 32 y.o. with pertinent PMH of right nephrectomy who presented with flank pain, fever, and urinary urgency and admit for E coli bacteremia on hospital day 0  Plan:  E Coli bacteremia Acute complicated UTI History of right nephrectomy Patient presenting with several days of fevers, left flank pain, and urinary urgency. UA from 5/25 showed leukocytes, hematuria, and pyuria. CBC showed mild leukocytosis. CT renal showed no renal abscess, nephrolithiasis or obstruction. 2/2 blood culture grew E coli this morning. Her presentation is consistent with pyelonephritis complicated by bacteremia. I am not sure of the circumstances in childhood that lead to nephrectomy, she reports bad infection leading to surgery. Will repeat blood cultures and continue ceftriaxone. She does not have known diabetes or other risk factors for pyelonephritis.  -continue ceftriaxone day 2/7, 1st dose in ED 5/25 -repeat blood cultures -f/u urine culture -f/u sensitivities of blood cx results 5/25  Thrombocytopenia Platelets from 170 to 139 this morning. No recent history of heparin administration. Most likely related to acute infection. -trend CBC -tech smear  Elevated Bilirubin Tbili elevated from 1.7 to 1.4. Likely related to acute infection. I do not see other instances of elevated bilirubin for acute illness. -trend CMP  Best Practice: Diet: Regular diet VTE: rivaroxaban (XARELTO) tablet 10 mg Start: 01/16/23 1000 Code: Full AB: Ceftriaxone Status: Observation with expected length of stay less than 2 midnights. Anticipated Discharge Location: Home Barriers  to Discharge:  clearance of e coli from repeat blood cultures  Signature: Memory Dance. Catalaya Garr, D.O.  Internal Medicine Resident, PGY-2 [facility]  Pager: [phone] 12:07 PM, 01/16/2023   Please contact the on call pager after 5 pm and on weekends at [phone].

## 2023-01-16 NOTE — Plan of Care (Signed)

## 2023-01-16 NOTE — ED Notes (Addendum)
ED TO INPATIENT HANDOFF REPORT  ED Nurse Name and Phone #: Brett Canales [phone]  S Name/Age/Gender Natalie Armstrong 32 y.o. female Room/Bed: 034C/034C  Code Status   Code Status: Full Code  Home/SNF/Other Home Patient oriented to: self, place, time, and situation Is this baseline? Yes   Triage Complete: Triage complete  Chief Complaint Bacteremia due to Gram-negative bacteria [R78.81]  Triage Note Patient was seen earlier for dysuria and flank pain and fever had blood cultures done , cultures came back positive for ecoli , called and told to return for IV antifolics.    Allergies No Known Allergies  Level of Care/Admitting Diagnosis ED Disposition     ED Disposition  Admit   Condition  --   Comment  Hospital Area: [facility] [[id number]]  Level of Care: Med-Surg [16]  May place patient in observation at [facility] or [facility] if equivalent level of care is available:: No  Covid Evaluation: Asymptomatic - no recent exposure (last 10 days) testing not required  Diagnosis: Bacteremia due to Gram-negative bacteria [454098]  Admitting Physician: Miguel Aschoff [[id number]]  Attending Physician: Miguel Aschoff [[id number]]          B Medical/Surgery History Past Medical History:  Diagnosis Date   Anemia    Chronic kidney disease    Obesity    Post term pregnancy, 41 weeks 01/21/2016   Past Surgical History:  Procedure Laterality Date   CESAREAN SECTION     CESAREAN SECTION N/A 01/23/2016   Procedure: CESAREAN SECTION;  Surgeon: [place] Bing, MD;  Location: [facility];  Service: Obstetrics;  Laterality: N/A;   CHOLECYSTECTOMY     NEPHRECTOMY       A IV Location/Drains/Wounds Patient Lines/Drains/Airways Status     Active Line/Drains/Airways     Name Placement date Placement time Site Days   Peripheral IV 01/16/23 20 G Left Antecubital 01/16/23  0725  Antecubital  less than 1            Intake/Output Last 24 hours No  intake or output data in the 24 hours ending 01/16/23 0831  Labs/Imaging Results for orders placed or performed during the hospital encounter of 01/16/23 (from the past 48 hour(s))  CBC with Differential     Status: Abnormal   Collection Time: 01/16/23  7:25 AM  Result Value Ref Range   WBC 13.9 (H) 4.0 - 10.5 K/uL   RBC 4.32 3.87 - 5.11 MIL/uL   Hemoglobin 11.2 (L) 12.0 - 15.0 g/dL   HCT 11.9 (L) 14.7 - 82.9 %   MCV 81.9 80.0 - 100.0 fL   MCH 25.9 (L) 26.0 - 34.0 pg   MCHC 31.6 30.0 - 36.0 g/dL   RDW 56.2 13.0 - 86.5 %   Platelets 139 (L) 150 - 400 K/uL   nRBC 0.0 0.0 - 0.2 %   Neutrophils Relative % 81 %   Neutro Abs 11.3 (H) 1.7 - 7.7 K/uL   Lymphocytes Relative 13 %   Lymphs Abs 1.8 0.7 - 4.0 K/uL   Monocytes Relative 4 %   Monocytes Absolute 0.6 0.1 - 1.0 K/uL   Eosinophils Relative 1 %   Eosinophils Absolute 0.1 0.0 - 0.5 K/uL   Basophils Relative 0 %   Basophils Absolute 0.1 0.0 - 0.1 K/uL   Immature Granulocytes 1 %   Abs Immature Granulocytes 0.07 0.00 - 0.07 K/uL    Comment: Performed at [facility], [address], [place], [place] [address]  CT Renal Stone Study  Result Date: 01/15/2023 CLINICAL DATA:  Abdominal pain. EXAM: CT ABDOMEN AND PELVIS WITHOUT CONTRAST TECHNIQUE: Multidetector CT imaging of the abdomen and pelvis was performed following the standard protocol without IV contrast. RADIATION DOSE REDUCTION: This exam was performed according to the departmental dose-optimization program which includes automated exposure control, adjustment of the mA and/or kV according to patient size and/or use of iterative reconstruction technique. COMPARISON:  March 19, 2018 FINDINGS: Lower chest: No acute abnormality. Hepatobiliary: No focal liver abnormality is seen. Status post cholecystectomy. No biliary dilatation. Pancreas: Unremarkable. No pancreatic ductal dilatation or surrounding inflammatory changes. Spleen: Normal in size without focal abnormality.  Adrenals/Urinary Tract: Absent right kidney. Normal adrenal glands, left kidney and left ureter. Normal urinary bladder. Stomach/Bowel: Stomach is within normal limits. Appendix appears normal. No evidence of bowel wall thickening, distention, or inflammatory changes. Vascular/Lymphatic: No significant vascular findings are present. No enlarged abdominal or pelvic lymph nodes. Reproductive: 3 cm left adnexal mass measuring low-density. Other: No abdominal wall hernia or abnormality. No abdominopelvic ascites. Musculoskeletal: No acute or significant osseous findings. IMPRESSION: 1. No acute abnormality identified within the abdomen or pelvis. 2. Absent right kidney. 3. 3 cm left adnexal mass measuring low-density, likely a cyst. Further evaluation with pelvic ultrasound may be considered. Electronically Signed   By: Ted Mcalpine M.D.   On: 01/15/2023 16:39    Pending Labs Unresulted Labs (From admission, onward)     Start     Ordered   01/17/23 0500  HIV Antibody (routine testing w rflx)  (HIV Antibody (Routine testing w reflex) panel)  Tomorrow morning,   R        01/16/23 0821   01/17/23 0500  Basic metabolic panel  Tomorrow morning,   R        01/16/23 0821   01/17/23 0500  CBC  Tomorrow morning,   R        01/16/23 0821   01/16/23 0705  Urine Culture  Once,   URGENT       Question:  Indication  Answer:  Flank Pain   01/16/23 0706   01/16/23 0704  Lactic acid, plasma  (Undifferentiated presentation (screening labs and basic nursing orders))  Now then every 2 hours,   R (with STAT occurrences)      01/16/23 0706   01/16/23 0704  Comprehensive metabolic panel  (Undifferentiated presentation (screening labs and basic nursing orders))  ONCE - STAT,   STAT        01/16/23 0706   01/16/23 0704  Blood Culture (routine x 2)  (Undifferentiated presentation (screening labs and basic nursing orders))  BLOOD CULTURE X 2,   STAT      01/16/23 0706   01/16/23 0704  Urinalysis, Routine w reflex  microscopic -Urine, Clean Catch  (Undifferentiated presentation (screening labs and basic nursing orders))  ONCE - URGENT,   URGENT       Question:  Specimen Source  Answer:  Urine, Clean Catch   01/16/23 0706            Vitals/Pain Today's Vitals   01/16/23 0657 01/16/23 0700  BP:  131/86  Pulse:  94  Resp:  18  Temp:  98.1 F (36.7 C)  TempSrc:  Oral  SpO2:  100%  Weight: 79.8 kg   Height: 5\' 4"  (1.626 m)     Isolation Precautions No active isolations  Medications Medications  rivaroxaban (XARELTO) tablet 10 mg (has no administration in time range)  cefTRIAXone (ROCEPHIN) 2 g in sodium chloride 0.9 % 100 mL IVPB (has no administration in time range)  lactated ringers bolus 1,000 mL (1,000 mLs Intravenous New Bag/Given 01/16/23 0733)  cefTRIAXone (ROCEPHIN) 2 g in sodium chloride 0.9 % 100 mL IVPB (2 g Intravenous New Bag/Given 01/16/23 7829)    Mobility walks     Focused Assessments Renal Assessment Handoff:  Left flank pain, dysuria   R Recommendations: See Admitting Provider Note  Report given to:   Additional Notes:  LR bolus still infusing

## 2023-01-16 NOTE — Hospital Course (Signed)
[facility]   ED course:  Ceftriazone LR 1L  Blood and urine cx sent UA with moderate hgb, protein, leukocytes LA 1.2 CT renal- absent r kidney, 3 cm left adnexal mass Cyst recommend pelvic US  Gram Negative rods in aerobic and anaerobic  Leukocytosis 11.4  Feels better today. No flank or back pain, urinary frequency is back to normal and she has no dysuria.    5/28: Virtual spanish interpreter present for this encounter. Feeling better this morning. No dysuria, no fever/chills night sweats, pain is better. Discussed discharge plan including antibiotic changes and clinic follow up and she agrees to discharge today.

## 2023-01-16 NOTE — Progress Notes (Signed)
T-shirt, pants, shoes, blanket, socks, cell phone

## 2023-01-16 NOTE — ED Triage Notes (Signed)
Patient was seen earlier for dysuria and flank pain and fever had blood cultures done , cultures came back positive for ecoli , called and told to return for IV antifolics.

## 2023-01-17 DIAGNOSIS — R7881 Bacteremia: Secondary | ICD-10-CM | POA: Diagnosis present

## 2023-01-17 DIAGNOSIS — N12 Tubulo-interstitial nephritis, not specified as acute or chronic: Secondary | ICD-10-CM | POA: Diagnosis present

## 2023-01-17 LAB — CBC
HCT: 36.2 % (ref 36.0–46.0)
Hemoglobin: 11.5 g/dL — ABNORMAL LOW (ref 12.0–15.0)
MCH: 25.7 pg — ABNORMAL LOW (ref 26.0–34.0)
MCHC: 31.8 g/dL (ref 30.0–36.0)
MCV: 80.8 fL (ref 80.0–100.0)
Platelets: 141 10*3/uL — ABNORMAL LOW (ref 150–400)
RBC: 4.48 MIL/uL (ref 3.87–5.11)
RDW: 13.1 % (ref 11.5–15.5)
WBC: 11.7 10*3/uL — ABNORMAL HIGH (ref 4.0–10.5)
nRBC: 0 % (ref 0.0–0.2)

## 2023-01-17 LAB — URINE CULTURE: Culture: NO GROWTH

## 2023-01-17 LAB — COMPREHENSIVE METABOLIC PANEL
ALT: 15 U/L (ref 0–44)
AST: 14 U/L — ABNORMAL LOW (ref 15–41)
Albumin: 2.8 g/dL — ABNORMAL LOW (ref 3.5–5.0)
Alkaline Phosphatase: 78 U/L (ref 38–126)
Anion gap: 8 (ref 5–15)
BUN: 6 mg/dL (ref 6–20)
CO2: 22 mmol/L (ref 22–32)
Calcium: 8.4 mg/dL — ABNORMAL LOW (ref 8.9–10.3)
Chloride: 107 mmol/L (ref 98–111)
Creatinine, Ser: 0.73 mg/dL (ref 0.44–1.00)
GFR, Estimated: >60 mL/min (ref >60)
Glucose, Bld: 94 mg/dL (ref 70–99)
Potassium: 3.8 mmol/L (ref 3.5–5.1)
Sodium: 137 mmol/L (ref 135–145)
Total Bilirubin: 0.6 mg/dL (ref 0.3–1.2)
Total Protein: 6.5 g/dL (ref 6.5–8.1)

## 2023-01-17 LAB — GLUCOSE, CAPILLARY: Glucose-Capillary: 91 mg/dL (ref 70–99)

## 2023-01-17 LAB — HIV ANTIBODY (ROUTINE TESTING W REFLEX): HIV Screen 4th Generation wRfx: NONREACTIVE

## 2023-01-17 LAB — CULTURE, BLOOD (ROUTINE X 2): Culture: NO GROWTH

## 2023-01-17 NOTE — TOC Initial Note (Signed)
Transition of Care [facility]) - Initial/Assessment Note    Patient Details  Name: Natalie Armstrong MRN: [id number] Date of Birth: 08-May-1991  Transition of Care [facility]) CM/SW Contact:    Ronny Bacon, RN Phone Number: 01/17/2023, 2:31 PM  Clinical Narrative: Using interpreter service # [phone],  spoke with patient and family at bedside. Patient lives at home with spouse and minor children. Patient currently does not have health insurance or PCP. Patient is interested in using on the indigent clinics in the area to establish primary care. Patient is currently unemployed. Family will be able to transport patient home at time of discharge. Patient independent at home and feels she is currently at baseline.                 Expected Discharge Plan: Home/Self Care Barriers to Discharge: No Barriers Identified   Patient Goals and CMS Choice Patient states their goals for this hospitalization and ongoing recovery are:: To go home          Expected Discharge Plan and Services       Living arrangements for the past 2 months: Single Family Home                                      Prior Living Arrangements/Services Living arrangements for the past 2 months: Single Family Home Lives with:: Spouse, Minor Children Patient language and need for interpreter reviewed:: Yes Do you feel safe going back to the place where you live?: Yes      Need for Family Participation in Patient Care: No (Comment) Care giver support system in place?: Yes (comment)   Criminal Activity/Legal Involvement Pertinent to Current Situation/Hospitalization: No - Comment as needed  Activities of Daily Living Home Assistive Devices/Equipment: None ADL Screening (condition at time of admission) Patient's cognitive ability adequate to safely complete daily activities?: Yes Is the patient deaf or have difficulty hearing?: No Does the patient have difficulty seeing, even when wearing glasses/contacts?:  No Does the patient have difficulty concentrating, remembering, or making decisions?: No Patient able to express need for assistance with ADLs?: No Does the patient have difficulty dressing or bathing?: No Independently performs ADLs?: Yes (appropriate for developmental age) Does the patient have difficulty walking or climbing stairs?: No Weakness of Legs: None Weakness of Arms/Hands: None  Permission Sought/Granted Permission sought to share information with : Case Manager, Family Supports Permission granted to share information with : Yes, Verbal Permission Granted  Share Information with NAME: Carron Brazen     Permission granted to share info w Relationship: Significant other     Emotional Assessment Appearance:: Appears stated age Attitude/Demeanor/Rapport: Engaged Affect (typically observed): Appropriate Orientation: : Oriented to Self, Oriented to Place, Oriented to  Time, Oriented to Situation Alcohol / Substance Use: Not Applicable Psych Involvement: No (comment)  Admission diagnosis:  Bacteremia [R78.81] Pyelonephritis [N12] Bacteremia due to Gram-negative bacteria [R78.81] Patient Active Problem List   Diagnosis Date Noted   Bacteremia due to Gram-negative bacteria 01/16/2023   S/P cesarean section 01/25/2016   Post term pregnancy, 41 weeks 01/21/2016   History of nephrectomy, 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/21/2016   Anemia affecting pregnancy 01/21/2016   H/O: C-section 01/21/2016   PCP:  Grayce Sessions, NP Pharmacy:   [phone] [place], [place] - [address] AT [facility] [address] [place] [address] Phone: [phone] Fax: [phone]  [id number] - [place] ([place]), [place] - [address] [place] [address] Phone: [phone] Fax: [phone]  [id number] [place], [place] - [address] AT [facility] [address] & [facility] [address] [place] [address] Phone: [phone] Fax: [phone]     Social Determinants of Health (SDOH) Social History: SDOH Screenings   Food Insecurity: No Food Insecurity (05/05/2021)  Transportation Needs: No Transportation Needs (05/05/2021)  Utilities: Not At Risk (01/16/2023)  Depression (PHQ2-9): Medium Risk (10/29/2019)  Tobacco Use: Low Risk  (01/16/2023)   SDOH Interventions: Utilities Interventions: Intervention Not Indicated   Readmission Risk Interventions     No data to display

## 2023-01-17 NOTE — Plan of Care (Signed)

## 2023-01-17 NOTE — Progress Notes (Signed)
   Subjective:  Ms Treml was feeling good this morning. Denies subjective fever, chills, dysuria, and flank or suprapubic pain. Understands reason for hospitalization. Reports hunger and has only eaten one meal since admission. Discussed plan to continue antibiotics and adjust regimen as necessary based on culture sensitivities. All questions were answered.    Objective: Vitals over previous 24hr: Vitals:   01/16/23 0936 01/16/23 1620 01/16/23 2010 01/17/23 0433  BP: 115/80 121/60 122/66 117/73  Pulse: 88 (!) 101 (!) 109 94  Resp: (!) 22 18 16 17   Temp: 97.8 F (36.6 C) 98.2 F (36.8 C) (!) 101.4 F (38.6 C) 98.7 F (37.1 C)  TempSrc: Oral  Oral Oral  SpO2: 100% 100% 100% 100%  Weight:      Height:        General:      awake and alert, sitting comfortably in chair, cooperative, communication through [place] phone interpretor, not in acute distress Eyes:      extraocular movements intact, conjunctivae pink, pupils round Lungs:      normal respiratory effort, breathing unlabored, symmetrical chest rise Abdomen:      no costovertebral tenderness Neurologic:      oriented to person-place-time, moving all extremities, no gross focal deficits Psychiatric:      euthymic mood with congruent affect, intelligible speech    Assessment/Plan: Mr Katrinka Blazing is a 32 year old with a past medical history of right nephrectomy presented with flank pain and urinary frequency, now admitted for treatment of bacteremia secondary to pyelonephritis.   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 Visited the [facility] ED on 5-25 for flank pain and dysuria where she was diagnosed with pyelonephritis. Abdominal CT negative for renal abscess, nephrolithiasis, and obstruction. She was given one dose of ceftriaxone and discharged with short cefdinir  course. Blood cultures drawn at that time were positive for E.coli and Enterobacterales, patient instructed to return. Upon arrival, patient was afebrile and normotensive. Laboratory testing demonstrated mild leukocytosis and patient started on intravenous ceftriaxone. Although her symptoms have resolved, patient spiked fever to 101.45F overnight and would benefit from further intravenous antibiotics prior to discharge. Plan to narrow antibiotic regimen as able per culture susceptibilities.    > Ceftriaxone 2g q24  > Follow blood culture susceptibilities   > Trend CBC q24   ---Thrombocytopenia Upon arrival, laboratory testing demonstrated Plat 139. Rivaroxaban has been prescribed for deep vein thrombosis prophylaxis during current hospitalization. Etiology of thrombocytopenia possibly infection, specifically bacteremia and pyelonephritis.   > Trend CBC q24   ---Bilirubinemia Upon arrival, laboratory testing demonstrated TBili 1.4 that has since normalized. Liver transaminases both with normal limits. Etiology most likely direct hepatocellular damage from bacterial toxins secondary to infection.   > Trend CMP q24     Principal Problem:   Bacteremia due to Gram-negative bacteria    Prior to Admission Living Arrangement: home with family Anticipated Discharge Location: home Barriers to Discharge: intravenous antibiotics Dispo: Anticipated discharge in approximately 1-2 day(s).    Lajuana Ripple, MD Internal Medicine PGY-1 Pager [phone]  After 5pm on weekdays and 1pm on weekends: On Call pager [phone]

## 2023-01-18 ENCOUNTER — Other Ambulatory Visit (HOSPITAL_COMMUNITY): Payer: Self-pay

## 2023-01-18 DIAGNOSIS — N12 Tubulo-interstitial nephritis, not specified as acute or chronic: Principal | ICD-10-CM

## 2023-01-18 DIAGNOSIS — R7881 Bacteremia: Secondary | ICD-10-CM

## 2023-01-18 LAB — COMPREHENSIVE METABOLIC PANEL
ALT: 15 U/L (ref 0–44)
AST: 14 U/L — ABNORMAL LOW (ref 15–41)
Albumin: 3 g/dL — ABNORMAL LOW (ref 3.5–5.0)
Alkaline Phosphatase: 81 U/L (ref 38–126)
Anion gap: 9 (ref 5–15)
BUN: 8 mg/dL (ref 6–20)
CO2: 21 mmol/L — ABNORMAL LOW (ref 22–32)
Calcium: 8.2 mg/dL — ABNORMAL LOW (ref 8.9–10.3)
Chloride: 103 mmol/L (ref 98–111)
Creatinine, Ser: 0.64 mg/dL (ref 0.44–1.00)
GFR, Estimated: >60 mL/min (ref >60)
Glucose, Bld: 104 mg/dL — ABNORMAL HIGH (ref 70–99)
Potassium: 3.8 mmol/L (ref 3.5–5.1)
Sodium: 133 mmol/L — ABNORMAL LOW (ref 135–145)
Total Bilirubin: 0.4 mg/dL (ref 0.3–1.2)
Total Protein: 6.9 g/dL (ref 6.5–8.1)

## 2023-01-18 LAB — CBC
HCT: 38.3 % (ref 36.0–46.0)
Hemoglobin: 12.4 g/dL (ref 12.0–15.0)
MCH: 25.6 pg — ABNORMAL LOW (ref 26.0–34.0)
MCHC: 32.4 g/dL (ref 30.0–36.0)
MCV: 79 fL — ABNORMAL LOW (ref 80.0–100.0)
Platelets: 118 10*3/uL — ABNORMAL LOW (ref 150–400)
RBC: 4.85 MIL/uL (ref 3.87–5.11)
RDW: 13.1 % (ref 11.5–15.5)
WBC: 8.1 10*3/uL (ref 4.0–10.5)
nRBC: 0 % (ref 0.0–0.2)

## 2023-01-18 LAB — CULTURE, BLOOD (ROUTINE X 2): Culture: NO GROWTH

## 2023-01-18 MED ORDER — CIPROFLOXACIN HCL 500 MG PO TABS
500.0000 mg | ORAL_TABLET | Freq: Two times a day (BID) | ORAL | 0 refills | Status: DC
  Filled 2023-01-18 – 2023-01-19 (×2): qty 6, 3d supply, fill #0

## 2023-01-18 MED ORDER — CIPROFLOXACIN HCL 500 MG PO TABS: 500.0000 mg | ORAL_TABLET | Freq: Two times a day (BID) | ORAL | Status: DC

## 2023-01-18 NOTE — Plan of Care (Signed)

## 2023-01-18 NOTE — Discharge Summary (Addendum)
Name: Natalie Armstrong MRN: [id number] DOB: 30-Mar-1991 32 y.o. PCP: Grayce Sessions, NP  Date of Admission: 01/16/2023  6:51 AM Date of Discharge: 01-18-2023 Attending Physician: Mercie Eon, MD  Discharge Diagnosis: Principal Problem:   Bacteremia due to Escherichia coli Active Problems:   Pyelonephritis of left kidney   Pyelonephritis     Discharge Medications: Allergies as of 01/18/2023   No Known Allergies      Medication List     TAKE these medications    ciprofloxacin 500 MG tablet Commonly known as: CIPRO Take 1 tablet (500 mg total) by mouth 2 (two) times daily. Tomar 1 comprimido 2 veces al da, ltima dosis el 31 de mayo. Start taking on: Jan 19, 2023   levonorgestrel-ethinyl estradiol 0.15-30 MG-MCG tablet Commonly known as: NORDETTE Take 1 tablet by mouth daily.         Disposition and follow-up:    Natalie Armstrong was discharged from [facility] in Stable condition.  At the hospital follow up visit please address:   1.  Bacteremia and pyelonephritis: inquire about symptoms including flank pain and urinary frequency, check temperature and perform urinalysis if indicated, confirm completion of ciprofloxacin regimen ending 5-31  2.  Thrombocytopenia: persisted throughout hospitalization, etiology remains unclear, recommend CBC and further diagnostic workup  3.  Bilirubinemia: resolved during hospitalization, likely secondary to infection, consider CMP  4.  Labs / imaging needed at time of follow-up: CBC  5.  Pending labs / tests needing follow-up: none    Follow-up Appointments:  Follow-up Information     [facility] Follow up.   Specialty: Family Medicine Why: Call to make an appointment once discharged. Contact information: [place] [facility] [id number] [phone]                Hospital Course by problem list:  Natalie Armstrong  is a 32 year old with a past medical history of right nephrectomy presented with flank pain and urinary frequency, admitted for treatment of bacteremia secondary to pyelonephritis.     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 Visited the [facility] ED on 5-25 for flank pain and dysuria where she was diagnosed with pyelonephritis. Abdominal CT negative for renal abscess, nephrolithiasis, and obstruction. She was given one dose of ceftriaxone and discharged with short cefdinir course. Blood cultures drawn at that time were positive for E.coli and Enterobacterales, patient instructed to return. Upon arrival, patient was afebrile and normotensive. Laboratory testing demonstrated mild leukocytosis and patient started on intravenous ceftriaxone. Symptoms and fever both resolved without recurrence following administration of antibiotics. Discharged with three-day course of ciprofloxacin, to which bacterium species was susceptible.     ---Thrombocytopenia Upon arrival, laboratory testing demonstrated Plat 139. Rivaroxaban was prescribed for deep vein thrombosis prophylaxis, heparin never administered. Etiology of thrombocytopenia possibly infection, though levels remained low after symptom and leukocytosis resolution. Recommend further diagnostic workup in outpatient setting.     ---Bilirubinemia Upon arrival, laboratory testing demonstrated TBili 1.4 that has since normalized. Liver transaminases both with normal limits. Etiology most likely direct hepatocellular damage from bacterial toxins secondary to infection. Bilirubin normalized during hospitalization and prior to discharge.       Discharge Exam:    BP 117/77 (BP Location: Right Arm)   Pulse 93   Temp 98.2 F (36.8 C)   Resp 18   Ht 5\' 4"  (  1.626 m)   Wt 79.8 kg   SpO2 99%   BMI 30.20 kg/m    Subjective: Patient reports feeling good this morning. Denies fever, chills, sweats, dysuria, frequency, and flank or abdominal pain. Discussed plan to discharge later today with short antibiotic course. Encouraged adequate hydration and rest to help body fight infection. All questions were answered.  General:                       awake and alert, sitting comfortably in chair, cooperative, communication through [place] video interpretor, not in acute distress Eyes:                            extraocular movements intact, conjunctivae pink, pupils round Lungs:                          normal respiratory effort, breathing unlabored, symmetrical chest rise, no crackles or wheezing Abdomen:                     no costovertebral or abdominal tenderness Neurologic:                   oriented to person-place-time, moving all extremities, no gross focal deficits Psychiatric:                   euthymic mood with congruent affect, intelligible speech     Pertinent Labs, Studies, and Procedures:   Labs:     Latest Ref Rng & Units 01/18/2023    3:13 AM 01/17/2023    4:46 AM 01/16/2023    7:25 AM  CBC  WBC 4.0 - 10.5 K/uL 8.1  11.7  13.9   Hemoglobin 12.0 - 15.0 g/dL 16.1  09.6  04.5   Hematocrit 36.0 - 46.0 % 38.3  36.2  35.4   Platelets 150 - 400 K/uL 118  141  139       Latest Ref Rng & Units 01/18/2023    5:53 AM 01/17/2023    4:46 AM 01/16/2023    7:25 AM  CMP  Glucose 70 - 99 mg/dL 409  94  811   BUN 6 - 20 mg/dL 8  6  7    Creatinine 0.44 - 1.00 mg/dL 9.14  7.82  9.56   Sodium 135 - 145 mmol/L 133  137  137   Potassium 3.5 - 5.1 mmol/L 3.8  3.8  3.1   Chloride 98 - 111 mmol/L 103  107  107   CO2 22 - 32 mmol/L 21  22  23    Calcium 8.9 - 10.3 mg/dL 8.2  8.4  7.7   Total Protein 6.5 - 8.1 g/dL 6.9  6.5  6.3   Total Bilirubin 0.3 - 1.2 mg/dL 0.4  0.6  1.4   Alkaline Phos 38 - 126 U/L 81  78  72   AST 15 - 41 U/L 14  14  15    ALT 0 - 44 U/L 15  15  14       ______________________  Imaging:  No results found.   ______________________  Procedures:  none  ______________________   Discharge Instructions:  Discharge Instructions     Diet - low sodium heart healthy   Complete by: As directed    Discharge instructions   Complete by: As directed    Sra. Barbaraann Rondo,  Fue un placer cuidar de usted  mientras estuvo en el hospital. Su fiebre y dolor en el costado fueron causados por una infeccin del tracto urinario que se extendi al rin. Le tratamos con antibiticos, que seguir tomando en casa durante cinco das Natalie. El antibitico que le hemos recetado se llama ciprofloxacino. Adems, es importante beber suficiente agua y [place] bien hidratado. Como se mencion anteriormente, puede reanudar una dieta normal, pero es posible que desee limitar la ingesta de alimentos con alto contenido de International aid/development worker procesada.  Mientras estuvo aqu, descubrimos que tiene un recuento bajo de plaquetas. No estamos seguros exactamente de por qu sus plaquetas estn bajas y le recomendamos que consulte con su proveedor de atencin primaria para Education officer, environmental Natalie pruebas. Estamos felices de verlo en Technical sales engineer de [facility] y hemos programado una visita de seguimiento al hospital. Est programado para el martes 6-4 a las 8:45am con el Dr. Sloan Leiter.  Visite el departamento de emergencias si sus sntomas, incluido dolor en el costado y [place], Environmental education officer.   Increase activity slowly   Complete by: As directed      Natalie Armstrong,  It was a pleasure taking care of you while you were in the hospital. Your fever and flank pain was caused by a urinary tract infection that spread to your kidney. We treated you with antibiotics, which you will continue to take at home for another five days. The antibiotic that we have prescribed is called ciprofloxacin. Additionally, it is important to drink enough water and stay well-hydrated. As discussed, you can  resume a normal diet but may want to limit your intake of foods high in processed sugar.  While you were here, we discovered that you have a low platelet count. We are unsure exactly why your platelets are low and recommend that you follow up with your primary care provider for further testing. We are happy to see you in our Internal Medicine Clinic and have scheduled a hospital follow-up visit. You are scheduled for Tuesday 6-4 at 8:45am with Dr Sloan Leiter.  Please visit the emergency department if your symptoms including flank pain and fever return.   All the best,   Signed: Lajuana Ripple, MD Internal Medicine PGY-1 Pager [phone]

## 2023-01-19 ENCOUNTER — Other Ambulatory Visit (HOSPITAL_COMMUNITY): Payer: Self-pay

## 2023-01-19 ENCOUNTER — Telehealth: Payer: Self-pay

## 2023-01-19 ENCOUNTER — Telehealth (HOSPITAL_BASED_OUTPATIENT_CLINIC_OR_DEPARTMENT_OTHER): Payer: Self-pay | Admitting: *Deleted

## 2023-01-19 LAB — CULTURE, BLOOD (ROUTINE X 2)

## 2023-01-19 NOTE — Transitions of Care (Post Inpatient/ED Visit) (Signed)
   01/19/2023  Name: Natalie Armstrong MRN: [id number] DOB: Oct 01, 1990  Today's TOC FU Call Status: Today's TOC FU Call Status:: Successful TOC FU Call Competed TOC FU Call Complete Date: 01/19/23  Transition Care Management Follow-up Telephone Call Date of Discharge: 01/18/23 Discharge Facility: [facility]) Type of Discharge: Inpatient Admission Primary Inpatient Discharge Diagnosis:: bacteremia How have you been since you were released from the hospital?: Better Any questions or concerns?: No  Items Reviewed: Did you receive and understand the discharge instructions provided?: Yes Medications obtained,verified, and reconciled?: Yes (Medications Reviewed) (She does not have the nordette and will discuss with her provider at the appt on 01/25/2023.) Any new allergies since your discharge?: No Dietary orders reviewed?: Yes Type of Diet Ordered:: heart healthy low sodium Do you have support at home?: Yes People in Home: spouse Name of Support/Comfort Primary Source: husband  Medications Reviewed Today: Medications Reviewed Today     Reviewed by Robyne Peers, RN (Case Manager) on 01/19/23 at 1228  Med List Status: <None>   Medication Order Taking? Sig Documenting Provider Last Dose Status Informant  ciprofloxacin (CIPRO) 500 MG tablet [id number]  Take 1 tablet (500 mg total) by mouth 2 (two) times daily. Tomar 1 comprimido 2 veces al da, ltima dosis el 31 de mayo. Masters, Florentina Addison, DO  Active   levonorgestrel-ethinyl estradiol (NORDETTE) 0.15-30 MG-MCG tablet [id number]  Take 1 tablet by mouth daily. [provider]  Active Self, Pharmacy Records           Med Note Phebe Colla Jan 17, 2023 12:41 PM) There are no electronic dispense records for this med in the past 12 months            Home Care and Equipment/Supplies: Were Home Health Services Ordered?: No Any new equipment or medical supplies ordered?: No  Functional Questionnaire: Do  you need assistance with bathing/showering or dressing?: No Do you need assistance with meal preparation?: No Do you need assistance with eating?: No Do you have difficulty maintaining continence: No Do you need assistance with getting out of bed/getting out of a chair/moving?: No Do you have difficulty managing or taking your medications?: No  Follow up appointments reviewed: PCP Follow-up appointment confirmed?: Yes Date of PCP follow-up appointment?: 01/25/23 Follow-up Provider: Internal Medicine Clinic.  She did not need to schedule an appt with Gwinda Passe, NP at [facility].  She has not seen Ms Randa Evens in 3 years. Specialist Hospital Follow-up appointment confirmed?: NA Do you need transportation to your follow-up appointment?: No Do you understand care options if your condition(s) worsen?: Yes-patient verbalized understanding    SIGNATURE Robyne Peers, RN

## 2023-01-19 NOTE — Telephone Encounter (Signed)
Post ED Visit - Positive Culture Follow-up  Culture report reviewed by antimicrobial stewardship pharmacist: [facility] []  Enzo Bi, Pharm.D. []  Celedonio Miyamoto, Pharm.D., BCPS AQ-ID []  Garvin Fila, Pharm.D., BCPS []  Georgina Pillion, [address].D., BCPS []  [place], [place].D., BCPS, AAHIVP []  Estella Husk, Pharm.D., BCPS, AAHIVP []  Lysle Pearl, PharmD, BCPS []  Phillips Climes, PharmD, BCPS []  Agapito Games, PharmD, BCPS []  Verlan Friends, PharmD []  Mervyn Gay, PharmD, BCPS []  Vinnie Level, PharmD  [facility] []  Len Childs, PharmD []  Greer Pickerel, PharmD []  Adalberto Cole, PharmD []  Perlie Gold, Rph []  Lonell Face) Jean Rosenthal, PharmD []  Earl Many, PharmD []  Junita Push, PharmD []  Dorna Leitz, PharmD []  Terrilee Files, PharmD []  Lynann Beaver, PharmD []  Keturah Barre, PharmD []  Loralee Pacas, PharmD []  Bernadene Person, PharmD   Positive urine culture Treated with Cefdinir, organism sensitive to the same and no further patient follow-up is required at this time.  Virl Axe [facility] 01/19/2023, 2:04 PM

## 2023-01-21 LAB — CULTURE, BLOOD (ROUTINE X 2)
Culture: NO GROWTH
Special Requests: ADEQUATE
Special Requests: ADEQUATE

## 2023-01-25 ENCOUNTER — Ambulatory Visit: Payer: Self-pay | Admitting: Internal Medicine

## 2023-01-25 ENCOUNTER — Encounter: Payer: Self-pay | Admitting: Internal Medicine

## 2023-01-25 VITALS — BP 110/76 | HR 79 | Temp 98.5°F | Ht 60.5 in | Wt 201.0 lb

## 2023-01-25 DIAGNOSIS — D696 Thrombocytopenia, unspecified: Secondary | ICD-10-CM

## 2023-01-25 DIAGNOSIS — R7881 Bacteremia: Secondary | ICD-10-CM

## 2023-01-25 DIAGNOSIS — B9620 Unspecified Escherichia coli [E. coli] as the cause of diseases classified elsewhere: Secondary | ICD-10-CM

## 2023-01-25 DIAGNOSIS — Z124 Encounter for screening for malignant neoplasm of cervix: Secondary | ICD-10-CM

## 2023-01-25 DIAGNOSIS — Z3009 Encounter for other general counseling and advice on contraception: Secondary | ICD-10-CM

## 2023-01-25 HISTORY — DX: Thrombocytopenia, unspecified: D69.6

## 2023-01-25 MED ORDER — LEVONORGESTREL-ETHINYL ESTRAD 0.15-30 MG-MCG PO TABS
1.0000 | ORAL_TABLET | Freq: Every day | ORAL | 11 refills | Status: DC
Start: 2023-01-25 — End: 2023-09-14

## 2023-01-25 NOTE — Progress Notes (Signed)
Subjective:  CC: establish care/ hospital follow-up  HPI:  [name] is a 32 y.o. female with a past medical history stated below and presents today for hospital follow-up for e coli bacteremia secondary to pyelonephritis.  In-person spanish interpreter present during duration of encounter. Please see problem based assessment and plan for additional details.  Past Medical History:  Diagnosis Date   Anemia    Obesity     Social History   Socioeconomic History   Marital status: Married    Spouse name: Onalee Hua   Number of children: 3   Years of education: Not on file   Highest education level: 6th grade  Occupational History   Not on file  Tobacco Use   Smoking status: Never   Smokeless tobacco: Never  Vaping Use   Vaping Use: Never used  Substance and Sexual Activity   Alcohol use: No   Drug use: No   Sexual activity: Yes    Birth control/protection: Pill  Other Topics Concern   Not on file  Social History Narrative   Not on file   Social Determinants of Health   Financial Resource Strain: Not on file  Food Insecurity: No Food Insecurity (01/25/2023)   Hunger Vital Sign    Worried About Running Out of Food in the Last Year: Never true    Ran Out of Food in the Last Year: Never true  Transportation Needs: No Transportation Needs (01/25/2023)   PRAPARE - Administrator, Civil Service (Medical): No    Lack of Transportation (Non-Medical): No  Physical Activity: Not on file  Stress: Not on file  Social Connections: Not on file  Intimate Partner Violence: Not on file    Review of Systems: ROS negative except for what is noted on the assessment and plan.  Objective:   Vitals:   01/25/23 0835  BP: 110/76  Pulse: 79  Temp: 98.5 F (36.9 C)  TempSrc: Oral  SpO2: 100%  Weight: 201 lb (91.2 kg)  Height: 5' 0.5" (1.537 m)    Physical Exam: Constitutional: well-appearing Cardiovascular: regular rate and rhythm, no  m/r/g Pulmonary/Chest: normal work of breathing on room air, lungs clear to auscultation bilaterally Abdominal: soft, non-tender, non-distended MSK: normal bulk and tone, no CVA tenderness Neurological: normal gait Skin: warm and dry   Assessment & Plan:  Bacteremia due to Escherichia coli Patient presents for follow-up after admission for e coli bacteremia from pyelonephritis. She was discharged with ciprofloxacin to complete antibiotic course. She took last dose on May 31st. Since being home, she feels like her energy has returned. She no longer has burning when peeing and feel generally back to normal.  Birth control counseling She takes levonorgestrel/ ethin 0.15/0.30 mg for birth control. She just picked up refill from health department so currently has medication.  She  would prefer to switch refills to this clinic. -levonorgestrel/ ethin 0.15/0.30 [facility] [address]  Cervical cancer screening Patient reports not having a pap smear in the past. 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 She is due to repeat pap smear. She would prefer to complete orange card paperwork and come back for repeat pap.  -repeat Pap at follow-up  Thrombocytopenia (HCC) Platelet count decreased during admission for bacteremia. Her baseline platelets within normal limits. Smear showed no schistocytes. Likely reactive due to acute infection. P: Repeat CBC    Patient discussed with Dr. Oswaldo Done  Rudene Christians, D.O. [facility] Internal Medicine  PGY-2 Pager: [phone]  Phone: [phone] Date 01/25/2023  Time 9:33 AM

## 2023-01-25 NOTE — Assessment & Plan Note (Signed)
Patient reports not having a pap smear in the past. 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 She is due to repeat pap smear. She would prefer to complete orange card paperwork and come back for repeat pap.  -repeat Pap at follow-up

## 2023-01-25 NOTE — Patient Instructions (Addendum)
Thank you, [name] for allowing [place] to provide your care today.   I am glad to hear you are doing so well after getting out of hospital. With completing antibiotics you infection has been treated. Please call clinic of you start having burning with peeing again and we can help with that.  I am rechecking blood work today to follow-up from infection and will call with results in a few days.  I recommend that you complete cervical cancer screening at follow-up. It not urgent so if you would like you can complete orange card paperwork and then follow-up for cervical cancer screening.  Gracias, [name] por permitirnos brindarle su atencin hoy.   Me alegra saber que se encuentra tan bien despus de salir del hospital. Con antibiticos completos su infeccin ha sido tratada. Llame a la clnica si comienza a tener ardor al orinar nuevamente y podemos ayudarlo con eso.  Estoy volviendo a Chief Operating Officer los [employer] de sangre hoy para Radio producer un seguimiento de la infeccin y Freight forwarder con los Natalie Armstrong.  Le recomiendo que complete la prueba de deteccin de cncer de cuello uterino en el seguimiento. No es urgente, por lo que si lo desea, puede completar el trmite de la tarjeta naranja y Investment banker, operational un seguimiento para la deteccin del cncer de cuello uterino  I have ordered the following labs for you:   Lab Orders         CBC no Diff      I have ordered the following medication/changed the following medications:   Stop the following medications: There are no discontinued medications.   Start the following medications: No orders of the defined types were placed in this encounter.    Follow up: 3 months   Remember to complete orange card paperwork. It would be good to complete cervical cancer screening at some time soon. Please f/u in 3 months to schedule pap smear  We look forward to seeing you next time. Please call our clinic at  [phone] if you have any questions or concerns. The best time to call is Monday-Friday from 9am-4pm, but there is someone available 24/7. If after hours or the weekend, call the main hospital number and ask for the Internal Medicine Resident On-Call. If you need medication refills, please notify your pharmacy one week in advance and they will send [place] a request.   Thank you for trusting me with your care. Wishing you the best!   Natalie Christians, DO [facility]  Seguimiento: 3 meses   Recuerda completar el trmite de la tarjeta naranja. Sera bueno completar pronto la deteccin del cncer de cuello uterino. Por favor f/u en 3 meses para programar la prueba de Papanicolaou  Esperamos verte la prxima vez. Llame a nuestra clnica al [phone] si tiene alguna pregunta o inquietud. El mejor momento para llamar es de lunes a viernes de 9 a. m. a 4 p. m., pero hay alguien disponible las 24 horas, los 7 [address]  [address] de la [place]. Si es fuera del horario de atencin o durante el fin de [place], llame al nmero principal del hospital y pregunte por el residente de guardia de medicina interna. Si necesita reabastecimiento de medicamentos, notifique a su farmacia con una semana de anticipacin y ellos nos enviarn una solicitud.   Gracias por confiarme tu cuidado. Deseandote lo mejor!   Natalie Harvie, DO [facility]

## 2023-01-25 NOTE — Assessment & Plan Note (Addendum)
She takes levonorgestrel/ ethin 0.15/0.30 mg for birth control. She just picked up refill from health department so currently has medication.  She  would prefer to switch refills to this clinic. -levonorgestrel/ ethin 0.15/0.30 mg refills sent to [facility] on [address]

## 2023-01-25 NOTE — Assessment & Plan Note (Signed)
Platelet count decreased during admission for bacteremia. Her baseline platelets within normal limits. Smear showed no schistocytes. Likely reactive due to acute infection. P: Repeat CBC

## 2023-01-25 NOTE — Assessment & Plan Note (Signed)
Patient presents for follow-up after admission for e coli bacteremia from pyelonephritis. She was discharged with ciprofloxacin to complete antibiotic course. She took last dose on May 31st. Since being home, she feels like her energy has returned. She no longer has burning when peeing and feel generally back to normal.

## 2023-01-26 LAB — CBC
Hematocrit: 39.4 % (ref 34.0–46.6)
Hemoglobin: 12.6 g/dL (ref 11.1–15.9)
MCH: 25.6 pg — ABNORMAL LOW (ref 26.6–33.0)
MCHC: 32 g/dL (ref 31.5–35.7)
MCV: 80 fL (ref 79–97)
Platelets: 415 10*3/uL (ref 150–450)
RBC: 4.92 x10E6/uL (ref 3.77–5.28)
RDW: 13 % (ref 11.7–15.4)
WBC: 8.4 10*3/uL (ref 3.4–10.8)

## 2023-01-26 NOTE — Addendum Note (Signed)
Addended by: Lucille Passy on: 01/26/2023 11:16 AM   Modules accepted: Level of Service

## 2023-01-26 NOTE — Progress Notes (Signed)
Internal Medicine Clinic Attending  Case discussed with Dr. Masters  At the time of the visit.  We reviewed the resident's history and exam and pertinent patient test results.  I agree with the assessment, diagnosis, and plan of care documented in the resident's note.  

## 2023-09-01 ENCOUNTER — Ambulatory Visit (INDEPENDENT_AMBULATORY_CARE_PROVIDER_SITE_OTHER): Payer: Self-pay | Admitting: Student

## 2023-09-01 ENCOUNTER — Encounter: Payer: Self-pay | Admitting: Student

## 2023-09-01 VITALS — BP 115/64 | HR 66 | Temp 98.4°F | Wt 209.8 lb

## 2023-09-01 DIAGNOSIS — Z124 Encounter for screening for malignant neoplasm of cervix: Secondary | ICD-10-CM

## 2023-09-01 DIAGNOSIS — Z3009 Encounter for other general counseling and advice on contraception: Secondary | ICD-10-CM

## 2023-09-01 NOTE — Assessment & Plan Note (Signed)
 This is a 33 year old female who presented to the clinic today for Pap smear. Patient reported that she had applied to orange card. Patient was informed that the orange card will only cover her expenses beyond [facility] health. She needs to apply for another form of application that was provided to her to help her pay for [facility] visits.  I discussed with her about 2 options that she has presently, she can either proceed with Pap smear today and get billed for it or she can wait to fill out the application get approved, come back and get Pap smear done.  Patient chose to fill out the application and come back when she has a coverage for her visits and test.  I also provided her with the outpatient free breast cancer/cervical cancer screening pamphlet if she is interested in the meantime.  Plan: - Pap smear in the next OV once patient has coverage - Refill evonorgestrel/ ethinyl estradiol (Altavera) 0.15/0.30 mg for birth control

## 2023-09-01 NOTE — Progress Notes (Signed)
 Established Patient Office Visit  Subjective   Patient ID: Natalie Armstrong, female    DOB: January 21, 1991  Age: 33 y.o. MRN: [id number]  Chief Complaint  Patient presents with   Annual Exam    Physical    HPI  This is a 33 year old female living with a history stated below and presents today for routine visit and pap smear. Please see problem based assessment and plan for additional details.   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   Past Medical History:  Diagnosis Date   Anemia    Obesity    Past Surgical History:  Procedure Laterality Date   ABDOMINAL HYSTERECTOMY     CESAREAN SECTION     CESAREAN SECTION N/A 01/23/2016   Procedure: CESAREAN SECTION;  Surgeon: Bebe Furry, MD;  Location: [facility] BIRTHING SUITES;  Service: Obstetrics;  Laterality: N/A;   CHOLECYSTECTOMY     NEPHRECTOMY     NO PAST SURGERIES     Social History   Tobacco Use   Smoking status: Never   Smokeless tobacco: Never  Vaping Use   Vaping status: Never Used  Substance Use Topics   Alcohol use: No   Drug use: No   Social History   Socioeconomic History   Marital status: Married    Spouse name: Alm   Number of children: 3   Years of education: Not on file   Highest education level: 6th grade  Occupational History   Not on file  Tobacco Use   Smoking status: Never   Smokeless tobacco: Never  Vaping Use   Vaping status: Never Used  Substance and Sexual Activity   Alcohol use: No   Drug use: No   Sexual activity: Yes    Birth control/protection: Pill  Other Topics Concern   Not on file  Social History Narrative   Not on file   Social Drivers of Health   Financial Resource Strain: Not on file   Food Insecurity: No Food Insecurity (01/25/2023)   Hunger Vital Sign    Worried About Running Out of Food in the Last Year: Never true    Ran Out of Food in the Last Year: Never true  Transportation Needs: No Transportation Needs (01/25/2023)   PRAPARE - Administrator, Civil Service (Medical): No    Lack of Transportation (Non-Medical): No  Physical Activity: Not on file  Stress: Not on file  Social Connections: Not on file  Intimate Partner Violence: Not on file   Family Status  Relation Name Status   Mother  Alive   Father  Deceased  No partnership data on file   History reviewed. No pertinent family history. No Known Allergies  ROS   ROS negative except for what is noted on the assessment and plan.  Objective:     BP 115/64 (BP Location: Right Arm, Patient Position: Sitting, Cuff Size: Normal)   Pulse 66   Temp 98.4 F (36.9 C) (Oral)   Wt 209 lb 12.8 oz (95.2 kg)   SpO2 100%   BMI 40.30 kg/m  BP Readings from Last 3 Encounters:  09/01/23 115/64  01/25/23 110/76  01/18/23 117/77   Wt Readings from Last 3 Encounters:  09/01/23 209 lb 12.8 oz (  95.2 kg)  01/25/23 201 lb (91.2 kg)  01/16/23 175 lb 14.8 oz (79.8 kg)   SpO2 Readings from Last 3 Encounters:  09/01/23 100%  01/25/23 100%  01/18/23 99%      Physical Exam  General: Sitting in chair, no acute distress Cardio: Regular rate and rhythm, no murmurs, rubs or gallops.   Pulmonary: Clear to ausculation bilaterally with no rales, rhonchi, and crackles  Abdomen: Soft, nontender with normoactive bowel sounds with no rebound or guarding  MSK: 5/5 strength to upper and lower extremities.   Neuro: Alert and oriented x3, no focal deficits   No results found for any visits on 09/01/23.  Last CBC Lab Results  Component Value Date   WBC 8.4 01/25/2023   HGB 12.6 01/25/2023   HCT 39.4 01/25/2023   MCV 80 01/25/2023   MCH 25.6 (L) 01/25/2023   RDW 13.0 01/25/2023   PLT 415 01/25/2023   Last  metabolic panel Lab Results  Component Value Date   GLUCOSE 104 (H) 01/18/2023   NA 133 (L) 01/18/2023   K 3.8 01/18/2023   CL 103 01/18/2023   CO2 21 (L) 01/18/2023   BUN 8 01/18/2023   CREATININE 0.64 01/18/2023   GFRNONAA >60 01/18/2023   CALCIUM 8.2 (L) 01/18/2023   PROT 6.9 01/18/2023   ALBUMIN 3.0 (L) 01/18/2023   LABGLOB 3.1 10/29/2019   AGRATIO 1.4 10/29/2019   BILITOT 0.4 01/18/2023   ALKPHOS 81 01/18/2023   AST 14 (L) 01/18/2023   ALT 15 01/18/2023   ANIONGAP 9 01/18/2023   Last lipids Lab Results  Component Value Date   CHOL 171 10/29/2019   HDL 47 10/29/2019   LDLCALC 107 (H) 10/29/2019   TRIG 93 10/29/2019   CHOLHDL 3.6 10/29/2019   Last hemoglobin A1c No results found for: HGBA1C Last thyroid functions Lab Results  Component Value Date   TSH 1.660 10/29/2019   Last vitamin D No results found for: 25OHVITD2, 25OHVITD3, VD25OH Last vitamin B12 and Folate No results found for: VITAMINB12, FOLATE    The ASCVD Risk score (Arnett DK, et al., 2019) failed to calculate for the following reasons:   The 2019 ASCVD risk score is only valid for ages 50 to 9    Assessment & Plan:   Problem List Items Addressed This Visit       Other   Birth control counseling   Cervical cancer screening - Primary   This is a 33 year old female who presented to the clinic today for Pap smear. Patient reported that she had applied to orange card. Patient was informed that the orange card will only cover her expenses beyond [facility] health. She needs to apply for another form of application that was provided to her to help her pay for [facility] Health visits.  I discussed with her about 2 options that she has presently, she can either proceed with Pap smear today and get billed for it or she can wait to fill out the application get approved, come back and get Pap smear done.  Patient chose to fill out the application and come back when she has a coverage for her visits and  test.  I also provided her with the outpatient free breast cancer/cervical cancer screening pamphlet if she is interested in the meantime.  Plan: - Pap smear in the next OV once patient has coverage - Refill evonorgestrel/ ethinyl estradiol (Altavera) 0.15/0.30 mg for birth control       No follow-ups on file.  Toma Edwards, DO

## 2023-09-02 NOTE — Progress Notes (Signed)
 Internal Medicine Clinic Attending  Case discussed with the resident at the time of the visit.  We reviewed the resident's history and exam and pertinent patient test results.  I agree with the assessment, diagnosis, and plan of care documented in the resident's note.

## 2023-09-14 ENCOUNTER — Other Ambulatory Visit (HOSPITAL_COMMUNITY)
Admission: RE | Admit: 2023-09-14 | Discharge: 2023-09-14 | Disposition: A | Discharge status: Home / Self Care | Payer: Self-pay | Source: Ambulatory Visit | Attending: Internal Medicine | Admitting: Internal Medicine

## 2023-09-14 ENCOUNTER — Ambulatory Visit: Payer: Self-pay | Admitting: Student

## 2023-09-14 VITALS — BP 114/71 | HR 98 | Temp 98.5°F | Ht 65.0 in | Wt 210.8 lb

## 2023-09-14 DIAGNOSIS — Z3009 Encounter for other general counseling and advice on contraception: Secondary | ICD-10-CM

## 2023-09-14 DIAGNOSIS — Z124 Encounter for screening for malignant neoplasm of cervix: Secondary | ICD-10-CM | POA: Insufficient documentation

## 2023-09-14 MED ORDER — LEVONORGESTREL-ETHINYL ESTRAD 0.15-30 MG-MCG PO TABS
1.0000 | ORAL_TABLET | Freq: Every day | ORAL | 11 refills | Status: DC
Start: 2023-09-14 — End: 2024-02-14

## 2023-09-14 NOTE — Patient Instructions (Addendum)
Natalie Armstrong, Natalie Armstrong por permitirnos brindarle atencin hoy a su salud.   He ordenado los siguientes laboratorios para usted:  Cytology- pap smear  Pruebas ordenadas hoy:  Referencias ordenadas hoy:  Referral Orders  No referral(s) requested today     He ordenado el siguiente medicamento/cambiado los siguientes medicamentos:  Suspender los siguientes medicamentos: There are no discontinued medications.   Iniciar los siguientes medicamentos: No orders of the defined types were placed in this encounter.    Seguimiento 3 meses   Recordar:   - Puede que tenga un poco de incomodidad o poco sangramiento despues del papanicolau.   - Le llamare ocn sus resultados.   Legrand Rams de llamarnos al [phone] si tiene alguna pregunta.   - Si sigue teniendo incomodidad durante relaciones intimas considere el uso de un lubricante como KY Jelly (sin fragancia ni color).      Si tiene alguna pregunta o inquietud, llame a la clnica de medicina interna al [phone].    Jayvion Stefanski Colbert Coyer, MD PGY-1 Internal Medicine Teaching Program  [facility]

## 2023-09-14 NOTE — Progress Notes (Incomplete)
   CC: ***  HPI:  [name] is a 33 y.o. female living with a history stated below and presents today for ***. Please see problem based assessment and plan for additional details.  Past Medical History:  Diagnosis Date   Anemia    Obesity     Current Outpatient Medications on File Prior to Visit  Medication Sig Dispense Refill   levonorgestrel-ethinyl estradiol (ALTAVERA) 0.15-30 MG-MCG tablet Take 1 tablet by mouth daily. 30 tablet 11   No current facility-administered medications on file prior to visit.    No family history on file.  Social History   Socioeconomic History   Marital status: Married    Spouse name: Onalee Hua   Number of children: 3   Years of education: Not on file   Highest education level: 6th grade  Occupational History   Not on file  Tobacco Use   Smoking status: Never   Smokeless tobacco: Never  Vaping Use   Vaping status: Never Used  Substance and Sexual Activity   Alcohol use: No   Drug use: No   Sexual activity: Yes    Birth control/protection: Pill  Other Topics Concern   Not on file  Social History Narrative   Not on file   Social Drivers of Health   Financial Resource Strain: Not on file  Food Insecurity: No Food Insecurity (01/25/2023)   Hunger Vital Sign    Worried About Running Out of Food in the Last Year: Never true    Ran Out of Food in the Last Year: Never true  Transportation Needs: No Transportation Needs (01/25/2023)   PRAPARE - Administrator, Civil Service (Medical): No    Lack of Transportation (Non-Medical): No  Physical Activity: Not on file  Stress: Not on file  Social Connections: Not on file  Intimate Partner Violence: Not on file    Review of Systems: ROS negative except for what is noted on the assessment and plan.  Vitals:   09/14/23 0958  BP: 114/71  Pulse: 98  Temp: 98.5 F (36.9 C)  TempSrc: Oral  SpO2: 100%  Weight: 210 lb 12.8 oz (95.6 kg)  Height: 5\' 5"  (1.651  m)    Physical Exam: Constitutional: well-appearing *** sitting in ***, in no acute distress HENT: normocephalic atraumatic, mucous membranes moist Eyes: conjunctiva non-erythematous Cardiovascular: regular rate and rhythm, no m/r/g Pulmonary/Chest: normal work of breathing on room air, lungs clear to auscultation bilaterally Abdominal: soft, non-tender, non-distended MSK: normal bulk and tone Neurological: alert & oriented x 3, no focal deficit Skin: warm and dry Psych: normal mood and behavior  Assessment & Plan:     Patient {GC/GE:[id number]::"discussed with","seen with"} Dr. {DGUYQ:[id number]::"ZDGLOVFI","E. Hoffman","Mullen","Narendra","Vincent","Guilloud","Lau","Machen"}  No problem-specific Assessment & Plan notes found for this encounter.   [name], MD [facility] Internal Medicine, PGY-1 Phone: [phone] Date 09/14/2023 Time 10:26 AM

## 2023-09-15 NOTE — Assessment & Plan Note (Addendum)
Patient is here for pap smear. LMP December, normal bleeding. No concerns for STI or pregnancy. One sexual partner (spouse). Denies discharge or lesions today. Did have an episode of post coital spotting, not much. Discussed use of water-based lubricants that are color and fragrance free. Patient states her OCP Erenest Rasher) was sent to the wrong pharmacy at the last visit and has not started it.  Plan - Pap smear today, follow up with results as needed - Sent OCP prescription to preferred pharmacy - Included information on lubricant use in AVS - Return to clinic in 3 months (or earlier as needed) for regular follow up

## 2023-09-15 NOTE — Progress Notes (Signed)
Established Patient Office Visit  Subjective   Patient ID: Natalie Armstrong, female    DOB: 05-Oct-1990  Age: 33 y.o. MRN: [id number]  Chief Complaint  Patient presents with   Gynecologic Exam   Medication Refill   Patient is a 33 y.o. with a past medical history stated below who presents today for follow-up for cervical cancer screening/pap smear. She was last seen at [facility] on 08/31/2022. Patient states she obtained her Orange card approval and provided a copy of her benefits. Please see problem based assessment and plan for additional details.      Gynecologic Exam Pertinent negatives include no dysuria, fever or rash.  Medication Refill Pertinent negatives include no fever or rash.   Past Medical History:  Diagnosis Date   Anemia    Obesity    Review of Systems  Constitutional:  Negative for fever.  Genitourinary:  Negative for dysuria.  Skin:  Negative for rash.     Objective:     BP 114/71 (BP Location: Right Arm, Patient Position: Sitting, Cuff Size: Small)   Pulse 98   Temp 98.5 F (36.9 C) (Oral)   Ht 5\' 5"  (1.651 m)   Wt 210 lb 12.8 oz (95.6 kg)   SpO2 100%   BMI 35.08 kg/m  BP Readings from Last 3 Encounters:  09/14/23 114/71  09/01/23 115/64  01/25/23 110/76   Wt Readings from Last 3 Encounters:  09/14/23 210 lb 12.8 oz (95.6 kg)  09/01/23 209 lb 12.8 oz (95.2 kg)  01/25/23 201 lb (91.2 kg)    Physical Exam HENT:     Head: Normocephalic and atraumatic.     Nose: Nose normal.     Mouth/Throat:     Mouth: Mucous membranes are moist.  Eyes:     Extraocular Movements: Extraocular movements intact.     Pupils: Pupils are equal, round, and reactive to light.  Pulmonary:     Effort: Pulmonary effort is normal.  Abdominal:     Palpations: Abdomen is soft.     Tenderness: There is no abdominal tenderness.  Genitourinary:    Comments: Pap smear completed. No erythema, edema, lesions, or discharge observed on outer exam. Small amount of  thin, milky appearing discharge observed on speculum exam. Cervix visualized, no signs of irritation, bleeding, or growths.  Skin:    General: Skin is warm.  Neurological:     General: No focal deficit present.     Mental Status: She is alert.  Psychiatric:        Mood and Affect: Mood normal.        Behavior: Behavior normal.    No results found for any visits on 09/14/23.  The ASCVD Risk score (Arnett DK, et al., 2019) failed to calculate for the following reasons:   The 2019 ASCVD risk score is only valid for ages 97 to 35    Assessment & Plan:   Problem List Items Addressed This Visit     Birth control counseling   Relevant Medications   levonorgestrel-ethinyl estradiol (ALTAVERA) 0.15-30 MG-MCG tablet   Cervical cancer screening - Primary   Patient is here for pap smear. LMP December, normal bleeding. No concerns for STI or pregnancy. One sexual partner (spouse). Denies discharge or lesions today. Did have an episode of post coital spotting, not much. Discussed use of water-based lubricants that are color and fragrance free. Patient states her OCP Erenest Rasher) was sent to the wrong pharmacy at the last visit and has not started it.  Plan - Pap smear today, follow up with results as needed - Sent OCP prescription to preferred pharmacy - Included information on lubricant use in AVS - Return to clinic in 3 months (or earlier as needed) for regular follow up      Relevant Orders   Cytology -Pap Smear   Patient discussed with Dr. Sol Blazing.   Return in about 3 months (around 12/13/2023) for Regular follow up, health maintenance as needed.   Malyna Budney Colbert Coyer, MD

## 2023-09-16 NOTE — Progress Notes (Signed)
Internal Medicine Clinic Attending  Case discussed with the resident at the time of the visit.  We reviewed the resident's history and exam and pertinent patient test results.  I agree with the assessment, diagnosis, and plan of care documented in the resident's note.

## 2023-09-16 NOTE — Addendum Note (Signed)
Addended by: Dickie La on: 09/16/2023 10:01 AM   Modules accepted: Level of Service

## 2023-09-19 LAB — CYTOLOGY - PAP
Adequacy: ABSENT
Comment: NEGATIVE
Diagnosis: NEGATIVE
High risk HPV: NEGATIVE

## 2023-12-12 LAB — OB RESULTS CONSOLE HEPATITIS B SURFACE ANTIGEN: Hepatitis B Surface Ag: NEGATIVE

## 2023-12-12 LAB — OB RESULTS CONSOLE ANTIBODY SCREEN: Antibody Screen: NEGATIVE

## 2023-12-12 LAB — OB RESULTS CONSOLE RUBELLA ANTIBODY, IGM: Rubella: IMMUNE

## 2023-12-12 LAB — OB RESULTS CONSOLE RPR: RPR: NONREACTIVE

## 2023-12-12 LAB — OB RESULTS CONSOLE HIV ANTIBODY (ROUTINE TESTING): HIV: NONREACTIVE

## 2023-12-12 LAB — HEPATITIS C ANTIBODY: HCV Ab: NEGATIVE

## 2023-12-12 LAB — OB RESULTS CONSOLE GC/CHLAMYDIA
Chlamydia: NEGATIVE
Neisseria Gonorrhea: NEGATIVE

## 2024-01-11 ENCOUNTER — Other Ambulatory Visit: Payer: Self-pay | Admitting: Nurse Practitioner

## 2024-01-11 DIAGNOSIS — Z36 Encounter for antenatal screening for chromosomal anomalies: Secondary | ICD-10-CM

## 2024-02-14 ENCOUNTER — Encounter: Payer: Self-pay | Admitting: *Deleted

## 2024-02-14 DIAGNOSIS — O09292 Supervision of pregnancy with other poor reproductive or obstetric history, second trimester: Secondary | ICD-10-CM | POA: Insufficient documentation

## 2024-02-14 DIAGNOSIS — O09522 Supervision of elderly multigravida, second trimester: Secondary | ICD-10-CM | POA: Insufficient documentation

## 2024-02-14 DIAGNOSIS — O99210 Obesity complicating pregnancy, unspecified trimester: Secondary | ICD-10-CM | POA: Insufficient documentation

## 2024-02-16 ENCOUNTER — Ambulatory Visit (HOSPITAL_BASED_OUTPATIENT_CLINIC_OR_DEPARTMENT_OTHER): Payer: MEDICAID

## 2024-02-16 ENCOUNTER — Other Ambulatory Visit: Payer: Self-pay | Admitting: *Deleted

## 2024-02-16 ENCOUNTER — Ambulatory Visit: Payer: MEDICAID | Attending: Obstetrics and Gynecology | Admitting: Maternal & Fetal Medicine

## 2024-02-16 VITALS — BP 106/53 | HR 93

## 2024-02-16 DIAGNOSIS — O99210 Obesity complicating pregnancy, unspecified trimester: Secondary | ICD-10-CM

## 2024-02-16 DIAGNOSIS — O99212 Obesity complicating pregnancy, second trimester: Secondary | ICD-10-CM | POA: Insufficient documentation

## 2024-02-16 DIAGNOSIS — Z36 Encounter for antenatal screening for chromosomal anomalies: Secondary | ICD-10-CM

## 2024-02-16 DIAGNOSIS — E669 Obesity, unspecified: Secondary | ICD-10-CM

## 2024-02-16 DIAGNOSIS — Z3A22 22 weeks gestation of pregnancy: Secondary | ICD-10-CM

## 2024-02-16 DIAGNOSIS — Z148 Genetic carrier of other disease: Secondary | ICD-10-CM | POA: Insufficient documentation

## 2024-02-16 DIAGNOSIS — O09292 Supervision of pregnancy with other poor reproductive or obstetric history, second trimester: Secondary | ICD-10-CM

## 2024-02-16 DIAGNOSIS — O09522 Supervision of elderly multigravida, second trimester: Secondary | ICD-10-CM

## 2024-02-16 DIAGNOSIS — Z362 Encounter for other antenatal screening follow-up: Secondary | ICD-10-CM

## 2024-02-16 DIAGNOSIS — O34219 Maternal care for unspecified type scar from previous cesarean delivery: Secondary | ICD-10-CM | POA: Insufficient documentation

## 2024-02-16 DIAGNOSIS — Z3A21 21 weeks gestation of pregnancy: Secondary | ICD-10-CM

## 2024-02-27 NOTE — Progress Notes (Signed)
 Marland Kitchen  mfm

## 2024-03-29 ENCOUNTER — Ambulatory Visit (HOSPITAL_BASED_OUTPATIENT_CLINIC_OR_DEPARTMENT_OTHER): Payer: MEDICAID | Admitting: Obstetrics and Gynecology

## 2024-03-29 ENCOUNTER — Ambulatory Visit: Payer: MEDICAID | Attending: Maternal & Fetal Medicine

## 2024-03-29 ENCOUNTER — Other Ambulatory Visit: Payer: Self-pay | Admitting: *Deleted

## 2024-03-29 DIAGNOSIS — O99212 Obesity complicating pregnancy, second trimester: Secondary | ICD-10-CM | POA: Insufficient documentation

## 2024-03-29 DIAGNOSIS — O09293 Supervision of pregnancy with other poor reproductive or obstetric history, third trimester: Secondary | ICD-10-CM

## 2024-03-29 DIAGNOSIS — O09522 Supervision of elderly multigravida, second trimester: Secondary | ICD-10-CM | POA: Insufficient documentation

## 2024-03-29 DIAGNOSIS — O09292 Supervision of pregnancy with other poor reproductive or obstetric history, second trimester: Secondary | ICD-10-CM | POA: Insufficient documentation

## 2024-03-29 DIAGNOSIS — E66813 Obesity, class 3: Secondary | ICD-10-CM

## 2024-03-29 DIAGNOSIS — Z3A28 28 weeks gestation of pregnancy: Secondary | ICD-10-CM

## 2024-03-29 DIAGNOSIS — O34219 Maternal care for unspecified type scar from previous cesarean delivery: Secondary | ICD-10-CM

## 2024-03-29 DIAGNOSIS — Z98891 History of uterine scar from previous surgery: Secondary | ICD-10-CM

## 2024-03-29 DIAGNOSIS — O99213 Obesity complicating pregnancy, third trimester: Secondary | ICD-10-CM

## 2024-03-29 DIAGNOSIS — O09523 Supervision of elderly multigravida, third trimester: Secondary | ICD-10-CM

## 2024-03-29 DIAGNOSIS — E669 Obesity, unspecified: Secondary | ICD-10-CM

## 2024-03-29 DIAGNOSIS — D563 Thalassemia minor: Secondary | ICD-10-CM

## 2024-03-29 DIAGNOSIS — O99210 Obesity complicating pregnancy, unspecified trimester: Secondary | ICD-10-CM | POA: Insufficient documentation

## 2024-03-29 DIAGNOSIS — Z362 Encounter for other antenatal screening follow-up: Secondary | ICD-10-CM | POA: Insufficient documentation

## 2024-03-29 DIAGNOSIS — O99013 Anemia complicating pregnancy, third trimester: Secondary | ICD-10-CM

## 2024-03-29 NOTE — Progress Notes (Signed)
 Maternal-Fetal Medicine Consultation Name: Natalie Armstrong MRN: [id number]  G3 P2002 at 28w 4d gestation.  Patient is here for completion of fetal anatomy.  Her high risk problems include: - Advanced maternal age.  On cell-free fetal DNA screening, the risks of fetal aneuploidies are not increased. - Previous 2 cesarean deliveries. - Pregravid BMI 41. - History of preeclampsia.   Ultrasound Fetal growth is appropriate for gestational age.  Amniotic fluid normal good fetal activity seen.  Fetal anatomical survey was completed and appears normal.  Placenta is posterior and there is no evidence of previa or placenta accreta spectrum.  Our concerns include Previous 2 cesarean deliveries I counseled the patient that repeat cesarean deliveries increase the risks of placenta previa and/or placenta accreta spectrum.  Long-term complications include small bowel obstruction.  Vaginal delivery after 2 cesarean deliveries associated with small increase in risk of uterine scar dehiscence (up to 2%). Patient is keen on repeat cesarean delivery.  Pregravid BMI 41 Grade 3 obesity is independently associated with increased risk of stillbirth (2.5- to 3-fold), but the absolute risk is very small.  I discussed protocol of weekly antenatal testing from [redacted] weeks gestation until delivery. Ultrasound has limitations in the resolution of ultrasound images and fetal anomalies may be missed.  Weight loss is not advised in pregnancy and a weight gain of 11 to 20 pounds is reasonable. I counseled the patient that history of preeclampsia associated with increased risk of recurrence of preeclampsia.  I encouraged her to continue low-dose aspirin that delays or prevents preeclampsia.  Recommendations: - Appointment was made for her to return in 6 weeks for fetal growth assessment and BPP. - Weekly BPP from [redacted] weeks gestation until delivery. -Patient will be screening for gestational diabetes today.   Consultation  including face-to-face (more than 50%) counseling 30 minutes.

## 2024-05-10 ENCOUNTER — Ambulatory Visit: Payer: MEDICAID | Attending: Obstetrics and Gynecology | Admitting: Maternal & Fetal Medicine

## 2024-05-10 ENCOUNTER — Other Ambulatory Visit: Payer: Self-pay | Admitting: *Deleted

## 2024-05-10 ENCOUNTER — Encounter: Payer: Self-pay | Admitting: Maternal & Fetal Medicine

## 2024-05-10 ENCOUNTER — Ambulatory Visit (HOSPITAL_BASED_OUTPATIENT_CLINIC_OR_DEPARTMENT_OTHER): Payer: MEDICAID

## 2024-05-10 VITALS — BP 118/72 | HR 127

## 2024-05-10 DIAGNOSIS — Z3A34 34 weeks gestation of pregnancy: Secondary | ICD-10-CM

## 2024-05-10 DIAGNOSIS — O34219 Maternal care for unspecified type scar from previous cesarean delivery: Secondary | ICD-10-CM

## 2024-05-10 DIAGNOSIS — O09293 Supervision of pregnancy with other poor reproductive or obstetric history, third trimester: Secondary | ICD-10-CM | POA: Insufficient documentation

## 2024-05-10 DIAGNOSIS — O3663X0 Maternal care for excessive fetal growth, third trimester, not applicable or unspecified: Secondary | ICD-10-CM

## 2024-05-10 DIAGNOSIS — O09522 Supervision of elderly multigravida, second trimester: Secondary | ICD-10-CM

## 2024-05-10 DIAGNOSIS — E669 Obesity, unspecified: Secondary | ICD-10-CM

## 2024-05-10 DIAGNOSIS — O09292 Supervision of pregnancy with other poor reproductive or obstetric history, second trimester: Secondary | ICD-10-CM

## 2024-05-10 DIAGNOSIS — O99213 Obesity complicating pregnancy, third trimester: Secondary | ICD-10-CM

## 2024-05-10 DIAGNOSIS — Z148 Genetic carrier of other disease: Secondary | ICD-10-CM | POA: Insufficient documentation

## 2024-05-10 DIAGNOSIS — O09523 Supervision of elderly multigravida, third trimester: Secondary | ICD-10-CM | POA: Insufficient documentation

## 2024-05-10 NOTE — Progress Notes (Signed)
   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 G3P2002 at [redacted]w[redacted]d here for ultrasound and consultation. Natalie Armstrong is doing well today with no acute concerns. Today we focused on the following:   We discussed the ultrasound findings of an LGA fetus.  The BPP is 8 out of 8.  Due to LGA and 2 prior cesarean deliveries recommend a 39-week delivery. The patient had time to ask questions that were answered to her satisfaction.  She verbalized understanding and agrees to proceed with the plan below.  Sonographic findings Single intrauterine pregnancy at 34w 4d.  Fetal cardiac activity:  Observed and appears normal. Presentation: Cephalic. Interval fetal anatomy appears normal. Fetal biometry shows the estimated fetal weight at the 93 percentile. Amniotic fluid volume: Within normal limits. MVP: 5.88 cm. Placenta: Posterior.  There are limitations of prenatal ultrasound such as the inability to detect certain abnormalities due to poor visualization. Various factors such as fetal position, gestational age and maternal body habitus may increase the difficulty in visualizing the fetal anatomy.    Recommendations - Continue weekly antenatal testing and serial growth due to LGA - Repeat CD at 39w  An interpreter was used for today's visit   Review of Systems: A review of  systems was performed and was negative except per HPI   Vitals and Physical Exam    05/10/2024   11:06 AM 03/29/2024    9:08 AM 02/16/2024    9:04 AM  Vitals with BMI  Systolic 118 106 893  Diastolic 72 75 53  Pulse 127 99 93    Sitting comfortably on the sonogram table Nonlabored breathing Normal rate and rhythm Abdomen is nontender  Past pregnancies OB History  Gravida Para Term Preterm AB Living  3 2 2   2   SAB IAB Ectopic Multiple Live Births     0 2    # Outcome Date GA Lbr Len/2nd Weight Sex Type Anes PTL Lv  3 Current           2 Term 01/23/16 [redacted]w[redacted]d  8 lb 11.3 oz (3.95 kg) F CS-LTranv EPI  LIV  1 Term      CS-LTranv        I spent 20 minutes reviewing the patients chart, including labs and images as well as counseling the patient about her medical conditions. Greater than 50% of the time was spent in direct face-to-face patient counseling.  Delora Smaller  MFM, [facility]   05/10/2024  11:42 AM

## 2024-05-11 ENCOUNTER — Other Ambulatory Visit: Payer: Self-pay | Admitting: Obstetrics and Gynecology

## 2024-05-11 DIAGNOSIS — O34219 Maternal care for unspecified type scar from previous cesarean delivery: Secondary | ICD-10-CM

## 2024-05-17 ENCOUNTER — Ambulatory Visit: Payer: MEDICAID | Attending: Obstetrics and Gynecology | Admitting: Maternal & Fetal Medicine

## 2024-05-17 ENCOUNTER — Ambulatory Visit (HOSPITAL_BASED_OUTPATIENT_CLINIC_OR_DEPARTMENT_OTHER): Payer: MEDICAID

## 2024-05-17 VITALS — BP 115/76 | HR 113

## 2024-05-17 DIAGNOSIS — Z148 Genetic carrier of other disease: Secondary | ICD-10-CM | POA: Insufficient documentation

## 2024-05-17 DIAGNOSIS — O34219 Maternal care for unspecified type scar from previous cesarean delivery: Secondary | ICD-10-CM

## 2024-05-17 DIAGNOSIS — E669 Obesity, unspecified: Secondary | ICD-10-CM

## 2024-05-17 DIAGNOSIS — O3663X0 Maternal care for excessive fetal growth, third trimester, not applicable or unspecified: Secondary | ICD-10-CM

## 2024-05-17 DIAGNOSIS — Z3A35 35 weeks gestation of pregnancy: Secondary | ICD-10-CM | POA: Insufficient documentation

## 2024-05-17 DIAGNOSIS — O99213 Obesity complicating pregnancy, third trimester: Secondary | ICD-10-CM

## 2024-05-17 DIAGNOSIS — O09293 Supervision of pregnancy with other poor reproductive or obstetric history, third trimester: Secondary | ICD-10-CM | POA: Insufficient documentation

## 2024-05-17 DIAGNOSIS — O09523 Supervision of elderly multigravida, third trimester: Secondary | ICD-10-CM | POA: Insufficient documentation

## 2024-05-17 DIAGNOSIS — O09522 Supervision of elderly multigravida, second trimester: Secondary | ICD-10-CM

## 2024-05-17 DIAGNOSIS — O09292 Supervision of pregnancy with other poor reproductive or obstetric history, second trimester: Secondary | ICD-10-CM

## 2024-05-17 NOTE — Progress Notes (Signed)
 After review, MFM consult with provider is not indicated for today  Natalie Nathanel Pipe, MD 05/17/2024 3:14 PM  [facility]

## 2024-05-24 ENCOUNTER — Ambulatory Visit: Payer: Self-pay | Attending: Obstetrics and Gynecology | Admitting: Obstetrics

## 2024-05-24 ENCOUNTER — Ambulatory Visit (HOSPITAL_BASED_OUTPATIENT_CLINIC_OR_DEPARTMENT_OTHER): Payer: MEDICAID

## 2024-05-24 VITALS — BP 107/67 | HR 111

## 2024-05-24 DIAGNOSIS — Z3A36 36 weeks gestation of pregnancy: Secondary | ICD-10-CM

## 2024-05-24 DIAGNOSIS — E669 Obesity, unspecified: Secondary | ICD-10-CM

## 2024-05-24 DIAGNOSIS — O99213 Obesity complicating pregnancy, third trimester: Secondary | ICD-10-CM | POA: Insufficient documentation

## 2024-05-24 DIAGNOSIS — O3663X0 Maternal care for excessive fetal growth, third trimester, not applicable or unspecified: Secondary | ICD-10-CM

## 2024-05-24 DIAGNOSIS — O09522 Supervision of elderly multigravida, second trimester: Secondary | ICD-10-CM

## 2024-05-24 DIAGNOSIS — O09523 Supervision of elderly multigravida, third trimester: Secondary | ICD-10-CM | POA: Insufficient documentation

## 2024-05-24 DIAGNOSIS — O09292 Supervision of pregnancy with other poor reproductive or obstetric history, second trimester: Secondary | ICD-10-CM

## 2024-05-24 NOTE — Progress Notes (Signed)
 MFM Note  Natalie Armstrong is currently at 36 weeks and 4 days.  She has been followed due to maternal obesity with a BMI of 40 and advanced maternal age.   She denies any problems since her last exam.  A biophysical profile performed today was 8/8.   The AFI was 12.64 cm.    Vertex presentation.    She will return in 1 week for an NST.    She is already scheduled for a repeat C-section on June 11, 2024.

## 2024-05-29 NOTE — Patient Instructions (Signed)
 Instrucciones Para Antes de la Ciruga   Su ciruga est programada para 06/11/2024  (your procedure is scheduled on) Entre por la entrada principal del [facility]  a las [address] de la [place] -(enter through the main entrance at [facility] at [employer] AM    [employer] telfono, [place] [phone] para informarnos de su llegada. (pick up phone, dial [phone] on arrival)     Por favor llame al [phone] si tiene algn problema en la maana de la ciruga (please call this number if you have any problems the morning of surgery.)                  Recuerde: (Remember)  No coma alimentos. (Do not eat food (After Midnight) Desps de medianoche)    Puede tomar lquidos claros/transparentes hasta su hora de llegada a las [phone]. Los lquidos claros son aquellos a travs de los cuales se puede ver. Pueden tener color, como la Cola o el Kool-Aid. Puede tomar t y caf, siempre y cuando no contengan leche ni ningn tipo de crema.    No use joyas, maquillaje de ojos, lpiz labial, crema para el cuerpo o esmalte de uas oscuro. (Do not wear jewelry, eye makeup, lipstick, body lotion, or dark fingernail polish). Puede usar desodorante (you may wear deodorant)    No se afeite 48 horas de su ciruga. (Do not shave 48 hours before your surgery)    No traiga objetos de valor al hospital.  [place] no se hace responsable de ninguna pertenencia, ni objetos de valor que haya trado al hospital. (Do not bring valuable to the hospital.  [place] is not responsible for any belongings or valuables brought to the hospital)   [facility] medicinas en la maana de la ciruga con un SORBITO de agua nada (take these meds the morning of surgery with a SIP of water)     Durante la ciruga no se pueden usar lentes de contacto, dentaduras o puentes. (Contacts, dentures or bridgework cannot be worn in surgery).   Si va a ser ingresado despus de la ciruga, deje la [employer] en el carro  hasta que se le haya asignado una habitacin. (If you are to be admitted after surgery, leave suitcase in car until your room has been assigned.)   A los pacientes que se les d de alta el mismo da no se les permitir manejar a casa.  (Patients discharged on the day of surgery will not be allowed to drive home)    [place] y nmero de telfono del [employer], multimedia na. (Name and telephone number of your driver)   Instrucciones especiales Shower using CHG 2 nights before surgery and the night before surgery.  If you shower the day of surgery use CHG.  Use special wash - you have one bottle of CHG for all showers.  You should use approximately 1/3 of the bottle for each shower. (Special Instructions)   Por favor, lea las hojas informativas que le entregaron. (Please read over the following fact sheets that you were given) Surgical Site Infection Prevention

## 2024-05-31 ENCOUNTER — Ambulatory Visit: Payer: MEDICAID | Attending: Obstetrics and Gynecology

## 2024-05-31 ENCOUNTER — Encounter (HOSPITAL_COMMUNITY): Payer: Self-pay

## 2024-05-31 DIAGNOSIS — O09523 Supervision of elderly multigravida, third trimester: Secondary | ICD-10-CM | POA: Insufficient documentation

## 2024-05-31 DIAGNOSIS — O99213 Obesity complicating pregnancy, third trimester: Secondary | ICD-10-CM | POA: Insufficient documentation

## 2024-05-31 DIAGNOSIS — O09522 Supervision of elderly multigravida, second trimester: Secondary | ICD-10-CM

## 2024-05-31 DIAGNOSIS — Z3A37 37 weeks gestation of pregnancy: Secondary | ICD-10-CM | POA: Insufficient documentation

## 2024-05-31 DIAGNOSIS — E669 Obesity, unspecified: Secondary | ICD-10-CM

## 2024-05-31 NOTE — Procedures (Signed)
 Natalie Armstrong 11/04/1990 [redacted]w[redacted]d  Fetus A Non-Stress Test Interpretation for 05/31/24  Indication: Obesity and Advanced Maternal Age - NST only  Fetal Heart Rate A Mode: External Baseline Rate (A): 140 bpm Variability: Moderate Accelerations: 15 x 15 Decelerations: None  Uterine Activity Mode: Palpation, Toco Contraction Frequency (min): none noted Resting Tone Palpated: Relaxed  Interpretation (Fetal Testing) Nonstress Test Interpretation: Reactive Comments: Reviewed with Dr. Arna

## 2024-06-07 ENCOUNTER — Ambulatory Visit: Payer: MEDICAID | Attending: Maternal & Fetal Medicine | Admitting: Obstetrics

## 2024-06-07 ENCOUNTER — Ambulatory Visit: Payer: MEDICAID

## 2024-06-07 ENCOUNTER — Other Ambulatory Visit: Payer: Self-pay

## 2024-06-07 ENCOUNTER — Other Ambulatory Visit: Payer: Self-pay | Admitting: Maternal & Fetal Medicine

## 2024-06-07 ENCOUNTER — Other Ambulatory Visit: Payer: Self-pay | Admitting: Family Medicine

## 2024-06-07 VITALS — BP 109/57 | HR 123

## 2024-06-07 DIAGNOSIS — O09292 Supervision of pregnancy with other poor reproductive or obstetric history, second trimester: Secondary | ICD-10-CM

## 2024-06-07 DIAGNOSIS — Z3A38 38 weeks gestation of pregnancy: Secondary | ICD-10-CM | POA: Insufficient documentation

## 2024-06-07 DIAGNOSIS — E669 Obesity, unspecified: Secondary | ICD-10-CM

## 2024-06-07 DIAGNOSIS — O09293 Supervision of pregnancy with other poor reproductive or obstetric history, third trimester: Secondary | ICD-10-CM | POA: Insufficient documentation

## 2024-06-07 DIAGNOSIS — O3663X0 Maternal care for excessive fetal growth, third trimester, not applicable or unspecified: Secondary | ICD-10-CM

## 2024-06-07 DIAGNOSIS — O34219 Maternal care for unspecified type scar from previous cesarean delivery: Secondary | ICD-10-CM

## 2024-06-07 DIAGNOSIS — O99213 Obesity complicating pregnancy, third trimester: Secondary | ICD-10-CM

## 2024-06-07 DIAGNOSIS — O09522 Supervision of elderly multigravida, second trimester: Secondary | ICD-10-CM

## 2024-06-07 DIAGNOSIS — O09523 Supervision of elderly multigravida, third trimester: Secondary | ICD-10-CM

## 2024-06-07 DIAGNOSIS — Z148 Genetic carrier of other disease: Secondary | ICD-10-CM | POA: Insufficient documentation

## 2024-06-07 NOTE — Progress Notes (Signed)
 MFM Note  Natalie Armstrong is currently at 38 weeks and 4 days.  She has been followed due to maternal obesity with a BMI of 40 and advanced maternal age.    She denies any problems since her last exam.  Sonographic findings Single intrauterine pregnancy. Fetal cardiac activity: Observed. Presentation: Cephalic. Fetal biometry shows the estimated fetal weight of 8 pounds 15 ounces which measures at the 95th percentile. Amniotic fluid: Within normal limits.  MVP: 5.51 cm. Placenta: Posterior. BPP: 8/8.   There are limitations of prenatal ultrasound such as the inability to detect certain abnormalities due to poor visualization. Various factors such as fetal position, gestational age and maternal body habitus may increase the difficulty in visualizing the fetal anatomy.    She is already scheduled for a repeat C-section on June 11, 2024 (in 4 days).    No further exams were scheduled in our office.

## 2024-06-08 ENCOUNTER — Encounter (HOSPITAL_COMMUNITY)
Admission: RE | Admit: 2024-06-08 | Discharge: 2024-06-08 | Disposition: A | Discharge status: Home / Self Care | Payer: MEDICAID | Source: Ambulatory Visit | Attending: Family Medicine | Admitting: Family Medicine

## 2024-06-08 DIAGNOSIS — Z01812 Encounter for preprocedural laboratory examination: Secondary | ICD-10-CM | POA: Insufficient documentation

## 2024-06-08 DIAGNOSIS — O34219 Maternal care for unspecified type scar from previous cesarean delivery: Secondary | ICD-10-CM | POA: Insufficient documentation

## 2024-06-08 DIAGNOSIS — Z3A38 38 weeks gestation of pregnancy: Secondary | ICD-10-CM | POA: Insufficient documentation

## 2024-06-08 HISTORY — DX: Supervision of pregnancy with other poor reproductive or obstetric history, unspecified trimester: O09.299

## 2024-06-08 LAB — CBC
HCT: 40.4 % (ref 36.0–46.0)
Hemoglobin: 12.8 g/dL (ref 12.0–15.0)
MCH: 25.5 pg — ABNORMAL LOW (ref 26.0–34.0)
MCHC: 31.7 g/dL (ref 30.0–36.0)
MCV: 80.6 fL (ref 80.0–100.0)
Platelets: 194 K/uL (ref 150–400)
RBC: 5.01 MIL/uL (ref 3.87–5.11)
RDW: 14.9 % (ref 11.5–15.5)
WBC: 8 K/uL (ref 4.0–10.5)
nRBC: 0 % (ref 0.0–0.2)

## 2024-06-08 LAB — TYPE AND SCREEN
ABO/RH(D): O POS
Antibody Screen: NEGATIVE

## 2024-06-08 LAB — RPR
RPR Ser Ql: REACTIVE — AB
RPR Titer: 1:1 {titer}

## 2024-06-11 ENCOUNTER — Other Ambulatory Visit: Payer: Self-pay

## 2024-06-11 ENCOUNTER — Encounter (HOSPITAL_COMMUNITY): Payer: Self-pay | Admitting: Family Medicine

## 2024-06-11 ENCOUNTER — Inpatient Hospital Stay (HOSPITAL_COMMUNITY): Payer: MEDICAID

## 2024-06-11 ENCOUNTER — Encounter (HOSPITAL_COMMUNITY): Admission: RE | Disposition: A | Payer: Self-pay | Source: Home / Self Care | Attending: Family Medicine

## 2024-06-11 ENCOUNTER — Ambulatory Visit: Payer: Self-pay | Admitting: Family Medicine

## 2024-06-11 ENCOUNTER — Inpatient Hospital Stay (HOSPITAL_COMMUNITY)
Admission: RE | Admit: 2024-06-11 | Discharge: 2024-06-13 | DRG: 784 | Disposition: A | Discharge status: Home / Self Care | Payer: MEDICAID | Attending: Family Medicine | Admitting: Family Medicine

## 2024-06-11 ENCOUNTER — Telehealth: Payer: Self-pay | Admitting: Family Medicine

## 2024-06-11 DIAGNOSIS — Z905 Acquired absence of kidney: Secondary | ICD-10-CM

## 2024-06-11 DIAGNOSIS — D62 Acute posthemorrhagic anemia: Secondary | ICD-10-CM | POA: Diagnosis not present

## 2024-06-11 DIAGNOSIS — Z3A39 39 weeks gestation of pregnancy: Secondary | ICD-10-CM

## 2024-06-11 DIAGNOSIS — O34211 Maternal care for low transverse scar from previous cesarean delivery: Principal | ICD-10-CM | POA: Diagnosis present

## 2024-06-11 DIAGNOSIS — O34219 Maternal care for unspecified type scar from previous cesarean delivery: Secondary | ICD-10-CM

## 2024-06-11 DIAGNOSIS — O09523 Supervision of elderly multigravida, third trimester: Secondary | ICD-10-CM

## 2024-06-11 DIAGNOSIS — Z302 Encounter for sterilization: Secondary | ICD-10-CM | POA: Diagnosis not present

## 2024-06-11 DIAGNOSIS — O9081 Anemia of the puerperium: Secondary | ICD-10-CM | POA: Diagnosis not present

## 2024-06-11 DIAGNOSIS — Z9851 Tubal ligation status: Secondary | ICD-10-CM

## 2024-06-11 DIAGNOSIS — O9962 Diseases of the digestive system complicating childbirth: Secondary | ICD-10-CM | POA: Diagnosis present

## 2024-06-11 DIAGNOSIS — O99214 Obesity complicating childbirth: Secondary | ICD-10-CM | POA: Diagnosis present

## 2024-06-11 DIAGNOSIS — R7689 Other specified abnormal immunological findings in serum: Secondary | ICD-10-CM | POA: Insufficient documentation

## 2024-06-11 DIAGNOSIS — Z555 Less than a high school diploma: Secondary | ICD-10-CM

## 2024-06-11 DIAGNOSIS — K219 Gastro-esophageal reflux disease without esophagitis: Secondary | ICD-10-CM | POA: Diagnosis present

## 2024-06-11 LAB — BASIC METABOLIC PANEL WITH GFR
Anion gap: 9 (ref 5–15)
BUN: 7 mg/dL (ref 6–20)
CO2: 21 mmol/L — ABNORMAL LOW (ref 22–32)
Calcium: 9 mg/dL (ref 8.9–10.3)
Chloride: 104 mmol/L (ref 98–111)
Creatinine, Ser: 0.53 mg/dL (ref 0.44–1.00)
GFR, Estimated: >60 mL/min (ref >60)
Glucose, Bld: 98 mg/dL (ref 70–99)
Potassium: 4.1 mmol/L (ref 3.5–5.1)
Sodium: 134 mmol/L — ABNORMAL LOW (ref 135–145)

## 2024-06-11 LAB — T.PALLIDUM AB, TOTAL: T Pallidum Abs: NONREACTIVE

## 2024-06-11 SURGERY — Surgical Case
Anesthesia: Spinal | Site: Abdomen | Laterality: Bilateral

## 2024-06-11 MED ORDER — DIPHENHYDRAMINE HCL 25 MG PO CAPS: 25.0000 mg | ORAL_CAPSULE | Freq: Four times a day (QID) | ORAL | Status: DC | PRN

## 2024-06-11 MED ORDER — WITCH HAZEL-GLYCERIN EX PADS: 1.0000 | MEDICATED_PAD | CUTANEOUS | Status: DC | PRN

## 2024-06-11 MED ORDER — PHENYLEPHRINE HCL-NACL 20-0.9 MG/250ML-% IV SOLN
INTRAVENOUS | Status: DC | PRN
  Administered 2024-06-11: 60 ug/min via INTRAVENOUS

## 2024-06-11 MED ORDER — BUPIVACAINE IN DEXTROSE 0.75-8.25 % IT SOLN
INTRATHECAL | Status: DC | PRN
  Administered 2024-06-11: 1.8 mL via INTRATHECAL

## 2024-06-11 MED ORDER — FENTANYL CITRATE (PF) 100 MCG/2ML IJ SOLN
INTRAMUSCULAR | Status: AC
  Filled 2024-06-11: qty 2

## 2024-06-11 MED ORDER — MENTHOL 3 MG MT LOZG: 1.0000 | LOZENGE | OROMUCOSAL | Status: DC | PRN

## 2024-06-11 MED ORDER — MORPHINE SULFATE (PF) 0.5 MG/ML IJ SOLN
INTRAMUSCULAR | Status: DC | PRN
  Administered 2024-06-11: 150 ug via INTRATHECAL

## 2024-06-11 MED ORDER — NALOXONE HCL 0.4 MG/ML IJ SOLN: 0.4000 mg | INTRAMUSCULAR | Status: DC | PRN

## 2024-06-11 MED ORDER — CEFAZOLIN SODIUM-DEXTROSE 2-4 GM/100ML-% IV SOLN
2.0000 g | INTRAVENOUS | Status: AC
Start: 2024-06-11 — End: 2024-06-11
  Administered 2024-06-11: 2 g via INTRAVENOUS

## 2024-06-11 MED ORDER — MEASLES, MUMPS & RUBELLA VAC ~~LOC~~ SUSR: 0.5000 mL | Freq: Once | SUBCUTANEOUS | Status: DC

## 2024-06-11 MED ORDER — FENTANYL CITRATE (PF) 100 MCG/2ML IJ SOLN: 25.0000 ug | INTRAMUSCULAR | Status: DC | PRN

## 2024-06-11 MED ORDER — DIBUCAINE (PERIANAL) 1 % EX OINT
1.0000 | TOPICAL_OINTMENT | CUTANEOUS | Status: DC | PRN
Start: 2024-06-11 — End: 2024-06-13

## 2024-06-11 MED ORDER — TRANEXAMIC ACID-NACL 1000-0.7 MG/100ML-% IV SOLN
INTRAVENOUS | Status: AC
  Filled 2024-06-11: qty 100

## 2024-06-11 MED ORDER — DEXAMETHASONE SOD PHOSPHATE PF 10 MG/ML IJ SOLN
INTRAMUSCULAR | Status: DC | PRN
  Administered 2024-06-11: 10 mg via INTRAVENOUS

## 2024-06-11 MED ORDER — ZOLPIDEM TARTRATE 5 MG PO TABS: 5.0000 mg | ORAL_TABLET | Freq: Every evening | ORAL | Status: DC | PRN

## 2024-06-11 MED ORDER — CEFAZOLIN SODIUM-DEXTROSE 2-4 GM/100ML-% IV SOLN
INTRAVENOUS | Status: AC
Start: 2024-06-11 — End: 2024-06-11
  Filled 2024-06-11: qty 100

## 2024-06-11 MED ORDER — ONDANSETRON HCL 4 MG/2ML IJ SOLN
INTRAMUSCULAR | Status: DC | PRN
  Administered 2024-06-11: 4 mg via INTRAVENOUS

## 2024-06-11 MED ORDER — COCONUT OIL OIL: 1.0000 | TOPICAL_OIL | Status: DC | PRN

## 2024-06-11 MED ORDER — POVIDONE-IODINE 10 % EX SWAB
2.0000 | Freq: Once | CUTANEOUS | Status: AC
  Administered 2024-06-11: 2 via TOPICAL

## 2024-06-11 MED ORDER — MAGNESIUM HYDROXIDE 400 MG/5ML PO SUSP: 30.0000 mL | ORAL | Status: DC | PRN

## 2024-06-11 MED ORDER — MEPERIDINE HCL 25 MG/ML IJ SOLN: 6.2500 mg | INTRAMUSCULAR | Status: DC | PRN

## 2024-06-11 MED ORDER — FENTANYL CITRATE (PF) 100 MCG/2ML IJ SOLN
INTRAMUSCULAR | Status: DC | PRN
  Administered 2024-06-11: 15 ug via INTRATHECAL

## 2024-06-11 MED ORDER — SOD CITRATE-CITRIC ACID 500-334 MG/5ML PO SOLN
ORAL | Status: AC
  Filled 2024-06-11: qty 30

## 2024-06-11 MED ORDER — DIPHENHYDRAMINE HCL 25 MG PO CAPS: 25.0000 mg | ORAL_CAPSULE | ORAL | Status: DC | PRN

## 2024-06-11 MED ORDER — SCOPOLAMINE 1 MG/3DAYS TD PT72
1.0000 | MEDICATED_PATCH | Freq: Once | TRANSDERMAL | Status: DC
  Administered 2024-06-11: 1 mg via TRANSDERMAL

## 2024-06-11 MED ORDER — LACTATED RINGERS IV SOLN: INTRAVENOUS | Status: DC

## 2024-06-11 MED ORDER — GABAPENTIN 300 MG PO CAPS
300.0000 mg | ORAL_CAPSULE | Freq: Two times a day (BID) | ORAL | Status: DC
  Administered 2024-06-11 – 2024-06-12 (×4): 300 mg via ORAL
  Filled 2024-06-11 (×4): qty 1

## 2024-06-11 MED ORDER — SENNOSIDES-DOCUSATE SODIUM 8.6-50 MG PO TABS
2.0000 | ORAL_TABLET | Freq: Every day | ORAL | Status: DC
  Administered 2024-06-12: 2 via ORAL
  Filled 2024-06-11: qty 2

## 2024-06-11 MED ORDER — PRENATAL MULTIVITAMIN CH
1.0000 | ORAL_TABLET | Freq: Every day | ORAL | Status: DC
  Administered 2024-06-11 – 2024-06-12 (×2): 1 via ORAL
  Filled 2024-06-11 (×2): qty 1

## 2024-06-11 MED ORDER — FERROUS SULFATE 325 (65 FE) MG PO TABS
325.0000 mg | ORAL_TABLET | ORAL | Status: DC
  Administered 2024-06-12: 325 mg via ORAL
  Filled 2024-06-11: qty 1

## 2024-06-11 MED ORDER — HYDROMORPHONE HCL 2 MG PO TABS: 2.0000 mg | ORAL_TABLET | ORAL | Status: DC | PRN

## 2024-06-11 MED ORDER — TRANEXAMIC ACID-NACL 1000-0.7 MG/100ML-% IV SOLN
1000.0000 mg | Freq: Once | INTRAVENOUS | Status: AC
  Administered 2024-06-11: 1000 mg via INTRAVENOUS

## 2024-06-11 MED ORDER — SOD CITRATE-CITRIC ACID 500-334 MG/5ML PO SOLN
30.0000 mL | Freq: Once | ORAL | Status: AC
  Administered 2024-06-11: 30 mL via ORAL

## 2024-06-11 MED ORDER — NALOXONE HCL 4 MG/10ML IJ SOLN: 1.0000 ug/kg/h | INTRAVENOUS | Status: DC | PRN

## 2024-06-11 MED ORDER — SIMETHICONE 80 MG PO CHEW: 80.0000 mg | CHEWABLE_TABLET | ORAL | Status: DC | PRN

## 2024-06-11 MED ORDER — ACETAMINOPHEN 500 MG PO TABS
1000.0000 mg | ORAL_TABLET | Freq: Four times a day (QID) | ORAL | Status: DC
  Administered 2024-06-12 – 2024-06-13 (×6): 1000 mg via ORAL
  Filled 2024-06-11 (×7): qty 2

## 2024-06-11 MED ORDER — MORPHINE SULFATE (PF) 0.5 MG/ML IJ SOLN
INTRAMUSCULAR | Status: AC
  Filled 2024-06-11: qty 10

## 2024-06-11 MED ORDER — ACETAMINOPHEN 10 MG/ML IV SOLN
INTRAVENOUS | Status: DC | PRN
  Administered 2024-06-11: 1000 mg via INTRAVENOUS

## 2024-06-11 MED ORDER — KETOROLAC TROMETHAMINE 30 MG/ML IJ SOLN
30.0000 mg | Freq: Four times a day (QID) | INTRAMUSCULAR | Status: AC | PRN
Start: 2024-06-11 — End: 2024-06-12

## 2024-06-11 MED ORDER — ONDANSETRON HCL 4 MG/2ML IJ SOLN
INTRAMUSCULAR | Status: AC
  Filled 2024-06-11: qty 2

## 2024-06-11 MED ORDER — SCOPOLAMINE 1 MG/3DAYS TD PT72
MEDICATED_PATCH | TRANSDERMAL | Status: AC
  Filled 2024-06-11: qty 1

## 2024-06-11 MED ORDER — IBUPROFEN 600 MG PO TABS
600.0000 mg | ORAL_TABLET | Freq: Four times a day (QID) | ORAL | Status: DC
  Administered 2024-06-12 – 2024-06-13 (×4): 600 mg via ORAL
  Filled 2024-06-11 (×4): qty 1

## 2024-06-11 MED ORDER — SODIUM CHLORIDE 0.9 % IR SOLN
Status: DC | PRN
  Administered 2024-06-11: 1

## 2024-06-11 MED ORDER — STERILE WATER FOR IRRIGATION IR SOLN
Status: DC | PRN
  Administered 2024-06-11: 1000 mL

## 2024-06-11 MED ORDER — DIPHENHYDRAMINE HCL 50 MG/ML IJ SOLN
INTRAMUSCULAR | Status: AC
  Filled 2024-06-11: qty 1

## 2024-06-11 MED ORDER — ONDANSETRON HCL 4 MG/2ML IJ SOLN: 4.0000 mg | Freq: Three times a day (TID) | INTRAMUSCULAR | Status: DC | PRN

## 2024-06-11 MED ORDER — OXYTOCIN-SODIUM CHLORIDE 30-0.9 UT/500ML-% IV SOLN
2.5000 [IU]/h | INTRAVENOUS | Status: AC
  Administered 2024-06-11: 2.5 [IU]/h via INTRAVENOUS
  Filled 2024-06-11: qty 500

## 2024-06-11 MED ORDER — SODIUM CHLORIDE 0.9% FLUSH: 3.0000 mL | INTRAVENOUS | Status: DC | PRN

## 2024-06-11 MED ORDER — DIPHENHYDRAMINE HCL 50 MG/ML IJ SOLN
12.5000 mg | INTRAMUSCULAR | Status: DC | PRN
  Administered 2024-06-11: 12.5 mg via INTRAVENOUS

## 2024-06-11 MED ORDER — PHENYLEPHRINE 80 MCG/ML (10ML) SYRINGE FOR IV PUSH (FOR BLOOD PRESSURE SUPPORT)
PREFILLED_SYRINGE | INTRAVENOUS | Status: DC | PRN
  Administered 2024-06-11: 80 ug via INTRAVENOUS

## 2024-06-11 MED ORDER — KETOROLAC TROMETHAMINE 30 MG/ML IJ SOLN: 30.0000 mg | Freq: Four times a day (QID) | INTRAMUSCULAR | Status: AC | PRN

## 2024-06-11 MED ORDER — KETOROLAC TROMETHAMINE 30 MG/ML IJ SOLN
30.0000 mg | Freq: Four times a day (QID) | INTRAMUSCULAR | Status: AC
  Administered 2024-06-11 – 2024-06-12 (×3): 30 mg via INTRAVENOUS
  Filled 2024-06-11 (×4): qty 1

## 2024-06-11 MED ORDER — OXYCODONE HCL 5 MG PO TABS: 5.0000 mg | ORAL_TABLET | ORAL | Status: DC | PRN

## 2024-06-11 MED ORDER — OXYTOCIN-SODIUM CHLORIDE 30-0.9 UT/500ML-% IV SOLN
INTRAVENOUS | Status: DC | PRN
  Administered 2024-06-11: 300 mL via INTRAVENOUS

## 2024-06-11 MED ORDER — ENOXAPARIN SODIUM 40 MG/0.4ML IJ SOSY
40.0000 mg | PREFILLED_SYRINGE | INTRAMUSCULAR | Status: DC
  Administered 2024-06-12 – 2024-06-13 (×2): 40 mg via SUBCUTANEOUS
  Filled 2024-06-11 (×2): qty 0.4

## 2024-06-11 SURGICAL SUPPLY — 32 items
CATH ROBINSON RED A/P 16FR (CATHETERS) IMPLANT
CHLORAPREP W/TINT 26ML (MISCELLANEOUS) ×2 IMPLANT
CLAMP UMBILICAL CORD (MISCELLANEOUS) ×1 IMPLANT
CLOTH BEACON ORANGE TIMEOUT ST (SAFETY) ×1 IMPLANT
DERMABOND ADVANCED .7 DNX12 (GAUZE/BANDAGES/DRESSINGS) ×2 IMPLANT
DRSG OPSITE POSTOP 4X10 (GAUZE/BANDAGES/DRESSINGS) ×1 IMPLANT
ELECTRODE REM PT RTRN 9FT ADLT (ELECTROSURGICAL) ×1 IMPLANT
EXTRACTOR VACUUM M CUP 4 TUBE (SUCTIONS) IMPLANT
GLOVE BIOGEL PI IND STRL 7.0 (GLOVE) ×1 IMPLANT
GLOVE BIOGEL PI IND STRL 7.5 (GLOVE) ×1 IMPLANT
GLOVE ECLIPSE 7.5 STRL STRAW (GLOVE) ×1 IMPLANT
GOWN STRL REUS W/TWL LRG LVL3 (GOWN DISPOSABLE) ×3 IMPLANT
KIT ABG SYR 3ML LUER SLIP (SYRINGE) IMPLANT
MAT PREVALON FULL STRYKER (MISCELLANEOUS) IMPLANT
NDL HYPO 25X5/8 SAFETYGLIDE (NEEDLE) IMPLANT
NEEDLE HYPO 25X5/8 SAFETYGLIDE (NEEDLE) IMPLANT
NS IRRIG 1000ML POUR BTL (IV SOLUTION) ×1 IMPLANT
PACK C SECTION WH (CUSTOM PROCEDURE TRAY) ×1 IMPLANT
PAD OB MATERNITY 4.3X12.25 (PERSONAL CARE ITEMS) ×1 IMPLANT
PENCIL SMOKE EVAC W/HOLSTER (ELECTROSURGICAL) ×1 IMPLANT
RETRACTOR TRAXI PANNICULUS (MISCELLANEOUS) IMPLANT
RTRCTR C-SECT PINK 25CM LRG (MISCELLANEOUS) ×1 IMPLANT
SUT MNCRL 0 VIOLET CTX 36 (SUTURE) ×2 IMPLANT
SUT PLAIN 0 NONE (SUTURE) IMPLANT
SUT PLAIN ABS 2-0 54XMFL TIE (SUTURE) ×1 IMPLANT
SUT VIC AB 0 CTX36XBRD ANBCTRL (SUTURE) ×1 IMPLANT
SUT VIC AB 2-0 CT1 TAPERPNT 27 (SUTURE) ×1 IMPLANT
SUT VIC AB 4-0 KS 27 (SUTURE) ×1 IMPLANT
SUTURE PLAIN GUT 2.0 ETHICON (SUTURE) IMPLANT
TOWEL OR 17X24 6PK STRL BLUE (TOWEL DISPOSABLE) ×1 IMPLANT
TRAY FOLEY W/BAG SLVR 14FR LF (SET/KITS/TRAYS/PACK) ×1 IMPLANT
WATER STERILE IRR 1000ML POUR (IV SOLUTION) ×1 IMPLANT

## 2024-06-11 NOTE — Transfer of Care (Signed)
 Immediate Anesthesia Transfer of Care Note  Patient: Natalie Armstrong  Procedure(s) Performed: CESAREAN SECTION, WITH BILATERAL TUBAL LIGATION (Bilateral: Abdomen)  Patient Location: PACU  Anesthesia Type:Spinal  Level of Consciousness: awake, alert , and oriented  Airway & Oxygen  Therapy: Patient Spontanous Breathing  Post-op Assessment: Report given to RN and Post -op Vital signs reviewed and stable  Post vital signs: Reviewed and stable  Last Vitals:  Vitals Value Taken Time  BP 105/79 06/11/24 12:08  Temp    Pulse 82 06/11/24 12:10  Resp 17 06/11/24 12:10  SpO2 96 % 06/11/24 12:10  Vitals shown include unfiled device data.  Last Pain:  Vitals:   06/11/24 0817  TempSrc: Oral  PainSc: 0-No pain         Complications: No notable events documented.

## 2024-06-11 NOTE — H&P (Signed)
 Faculty Practice H&P  Natalie Armstrong is a 33 y.o. female [phone] with IUP at [redacted]w[redacted]d presenting for elective repeat cesarean section with BTL. Pregnancy was been complicated by AMA, BMI 48.    Pt states she has been having no contractions, no vaginal bleeding, intact membranes, with normal fetal movement.     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  She plans to breastfeed  Prenatal labs and studies: ABO, Rh: --/--/O POS (10/17 9048) Antibody: NEG (10/17 9048) Rubella: Immune (04/21 0000) RPR: Reactive (10/17 1000)  HBsAg: Negative (04/21 0000)  HIV: Non-reactive (04/21 0000)  GBS:    2hr Glucola: negative Genetic screening: not done Anatomy US : normal  Past Medical History:  Past Medical History:  Diagnosis Date   Anemia    History of pre-eclampsia in prior pregnancy, currently pregnant    Obesity    Thrombocytopenia 01/25/2023    Past Surgical History:  Past Surgical History:  Procedure Laterality Date   CESAREAN SECTION     CESAREAN SECTION N/A 01/23/2016   Procedure: CESAREAN SECTION;  Surgeon: Bebe Furry, MD;  Location: [facility];  Service: Obstetrics;  Laterality: N/A;   CHOLECYSTECTOMY     NEPHRECTOMY      Obstetrical History:  OB History     Gravida  3   Para  2   Term  2   Preterm      AB      Living  2      SAB      IAB      Ectopic      Multiple  0   Live Births  2           Gynecological History:  OB History      Gravida  3   Para  2   Term  2   Preterm      AB      Living  2      SAB      IAB      Ectopic      Multiple  0   Live Births  2           Social History:  Social History   Socioeconomic History   Marital status: Married    Spouse name: Alm   Number of children: 3   Years of education: Not on file   Highest education level: 6th grade  Occupational History   Not on file  Tobacco Use   Smoking status: Never   Smokeless tobacco: Never  Vaping Use   Vaping status: Never Used  Substance and Sexual Activity   Alcohol use: No   Drug use: No   Sexual activity: Yes    Birth control/protection: Pill  Other Topics Concern   Not on file  Social History Narrative   Not on file   Social Drivers of Health   Financial Resource Strain: Not on file  Food Insecurity: No Food Insecurity (01/25/2023)   Hunger Vital Sign    Worried About Running Out of Food in the  Last Year: Never true    Ran Out of Food in the Last Year: Never true  Transportation Needs: No Transportation Needs (01/25/2023)   PRAPARE - Administrator, Civil Service (Medical): No    Lack of Transportation (Non-Medical): No  Physical Activity: Not on file  Stress: Not on file  Social Connections: Not on file    Family History:  Family History  Problem Relation Age of Onset   Diabetes Neg Hx    Hypertension Neg Hx     Medications:  Prenatal vitamins,  Current Facility-Administered Medications  Medication Dose Route Frequency Provider Last Rate Last Admin   ceFAZolin  (ANCEF ) IVPB 2g/100 mL premix  2 g Intravenous On Call to OR Ida Milbrath J, DO       lactated ringers  infusion   Intravenous Continuous Brittish Bolinger J, DO 125 mL/hr at 06/11/24 0915 Continued from Pre-op at 06/11/24 0915   scopolamine  (TRANSDERM-SCOP) 1 MG/3DAYS 1 mg  1 patch Transdermal Once Jerrye Sharper, MD   1 mg at 06/11/24 9162   tranexamic acid (CYKLOKAPRON) IVPB 1,000 mg  1,000 mg Intravenous Once  Teagon Kron J, DO        Allergies: No Known Allergies  Review of Systems: - negative  Physical Exam: Blood pressure 119/79, pulse 98, temperature 98.2 F (36.8 C), temperature source Oral, resp. rate 18, height 5' (1.524 m), weight 111.9 kg, last menstrual period 09/19/2023, SpO2 99%. GENERAL: Well-developed, well-nourished female in no acute distress.  LUNGS: Clear to auscultation bilaterally.  HEART: Regular rate and rhythm. ABDOMEN: Soft, nontender, nondistended, gravid.  EXTREMITIES: Nontender, no edema, 2+ distal pulses.    Pertinent Labs/Studies:   Lab Results  Component Value Date   WBC 8.0 06/08/2024   HGB 12.8 06/08/2024   HCT 40.4 06/08/2024   MCV 80.6 06/08/2024   PLT 194 06/08/2024    Assessment : Natalie Armstrong is a 33 y.o. G3P2002 at [redacted]w[redacted]d being admitted for cesarean section secondary to elective document  Plan: The risks of cesarean section discussed with the patient included but were not limited to: bleeding which may require transfusion or reoperation; infection which may require antibiotics; injury to bowel, bladder, ureters or other surrounding organs; injury to the fetus; need for additional procedures including hysterectomy in the event of a life-threatening hemorrhage; placental abnormalities wth subsequent pregnancies, incisional problems, thromboembolic phenomenon and other postoperative/anesthesia complications. The patient concurred with the proposed plan, giving informed written consent for the procedure.   Patient has been NPO since last night and will remain NPO for procedure.  Preoperative prophylactic Ancef  ordered on call to the OR.   Patient wants BTL. Discussed risk. Will do partial salpingectomy. Due to no insurance, will not send tubes to pathology. We discussed low chance of missing possible cancer.   Jasdeep Kepner J, DO 06/11/2024, 9:41 AM

## 2024-06-11 NOTE — Anesthesia Preprocedure Evaluation (Addendum)
 Anesthesia Evaluation  Patient identified by MRN, date of birth, ID band Patient awake    Reviewed: Allergy & Precautions, NPO status , Patient's Chart, lab work & pertinent test results  Airway Mallampati: III  TM Distance: >3 FB     Dental  (+) Teeth Intact, Dental Advisory Given   Pulmonary neg pulmonary ROS   Pulmonary exam normal breath sounds clear to auscultation       Cardiovascular negative cardio ROS Normal cardiovascular exam Rhythm:Regular Rate:Normal     Neuro/Psych negative neurological ROS  negative psych ROS   GI/Hepatic Neg liver ROS,GERD  ,,  Endo/Other  Obesity  Renal/GU Renal diseaseHx/o pyelonephritis  Hx/o nephrolithiasis S/P right nephrectomy  negative genitourinary   Musculoskeletal   Abdominal  (+) + obese  Peds  Hematology  (+) Blood dyscrasia, anemia Hx/o thrombocytopenia   Anesthesia Other Findings   Reproductive/Obstetrics (+) Pregnancy Hx/o Previous C/Section x 2 Undesired fertility                              Anesthesia Physical Anesthesia Plan  ASA: 3  Anesthesia Plan: Spinal and Combined Spinal and Epidural   Post-op Pain Management: Minimal or no pain anticipated   Induction: Intravenous  PONV Risk Score and Plan: 4 or greater and Treatment may vary due to age or medical condition and Scopolamine  patch - Pre-op  Airway Management Planned: Natural Airway  Additional Equipment:   Intra-op Plan:   Post-operative Plan:   Informed Consent: I have reviewed the patients History and Physical, chart, labs and discussed the procedure including the risks, benefits and alternatives for the proposed anesthesia with the patient or authorized representative who has indicated his/her understanding and acceptance.     Dental advisory given  Plan Discussed with: Anesthesiologist and CRNA  Anesthesia Plan Comments:          Anesthesia Quick  Evaluation

## 2024-06-11 NOTE — Anesthesia Procedure Notes (Signed)
 Spinal  Patient location during procedure: OR Start time: 06/11/2024 10:20 AM End time: 06/11/2024 10:31 AM Reason for block: surgical anesthesia Staffing Anesthesiologist: Jerrye Sharper, MD Performed by: Jerrye Sharper, MD Authorized by: Jerrye Sharper, MD   Preanesthetic Checklist Completed: patient identified, IV checked, site marked, risks and benefits discussed, surgical consent, monitors and equipment checked, pre-op evaluation and timeout performed Spinal Block Patient position: sitting Prep: DuraPrep and site prepped and draped Patient monitoring: heart rate, cardiac monitor, continuous pulse ox and blood pressure Approach: midline Location: L4-5 Injection technique: single-shot Needle Needle type: Spinocan and Tuohy  Needle gauge: 24 G Needle length: 9 cm Needle insertion depth: 8 cm Assessment Sensory level: T4 Events: CSF return Additional Notes Attempted CSE. Attempt x 2 due to positioning problems. Epidural space ID'd on 2nd attempt using LOR with air. Spinal performed through the epidural needle. Blood noted in the epidural hub, so CSE abandoned and will just do procedure under SAB only. Dr. Barbra informed. Adequate sensory level obtained.

## 2024-06-11 NOTE — Anesthesia Postprocedure Evaluation (Signed)
 Anesthesia Post Note  Patient: Natalie Armstrong  Procedure(s) Performed: CESAREAN SECTION, WITH BILATERAL TUBAL LIGATION (Bilateral: Abdomen)     Patient location during evaluation: PACU Anesthesia Type: Spinal Level of consciousness: oriented and awake and alert Pain management: pain level controlled Vital Signs Assessment: post-procedure vital signs reviewed and stable Respiratory status: spontaneous breathing, respiratory function stable and nonlabored ventilation Cardiovascular status: blood pressure returned to baseline and stable Postop Assessment: no headache, no backache, no apparent nausea or vomiting, patient able to bend at knees and spinal receding Anesthetic complications: no   No notable events documented.  Last Vitals:  Vitals:   06/11/24 1315 06/11/24 1325  BP: 102/85 113/71  Pulse: 75   Resp: 14 18  Temp: (!) 36.3 C (!) 36.3 C  SpO2: 98% 100%    Last Pain:  Vitals:   06/11/24 1325  TempSrc: Oral  PainSc:    Pain Goal:    LLE Motor Response: Purposeful movement (06/11/24 1315) LLE Sensation: Tingling (06/11/24 1315) RLE Motor Response: Purposeful movement (06/11/24 1315) RLE Sensation: Tingling (06/11/24 1315)     Epidural/Spinal Function Cutaneous sensation: Pins and Needles (06/11/24 1315), Patient able to flex knees: No (06/11/24 1315), Patient able to lift hips off bed: No (06/11/24 1315), Back pain beyond tenderness at insertion site: No (06/11/24 1315), Progressively worsening motor and/or sensory loss: No (06/11/24 1315), Bowel and/or bladder incontinence post epidural: No (06/11/24 1315)  Irianna Gilday A.

## 2024-06-11 NOTE — Discharge Summary (Signed)
 Postpartum Discharge Summary  Date of Service updated***     Patient Name: Natalie Armstrong DOB: [birth year] MRN: [id number]  Date of admission: 06/11/2024 Delivery date:06/11/2024 Delivering provider: BARBRA LANG PARAS Date of discharge: 06/11/2024  Admitting diagnosis: Maternal care due to low transverse uterine scar from previous cesarean delivery [O34.211] Encounter for sterilization [Z30.2] Delivery by elective cesarean section [O82] Intrauterine pregnancy: [redacted]w[redacted]d     Secondary diagnosis:  Principal Problem:   Delivery by elective cesarean section  Additional problems: ***    Discharge diagnosis: {DX.:23714}                                              Post partum procedures:{Postpartum procedures:23558} Augmentation: N/A Complications: None  Hospital course: Sceduled C/S   33 y.o. yo G3P2002 at [redacted]w[redacted]d was admitted to the hospital 06/11/2024 for scheduled cesarean section with the following indication:Elective Repeat.Delivery details are as follows:  Membrane Rupture Time/Date: 10:57 AM,06/11/2024  Delivery Method:C-Section, Low Transverse Operative Delivery:N/A Details of operation can be found in separate operative note.  Patient had a postpartum course complicated by***.  She is ambulating, tolerating a regular diet, passing flatus, and urinating well. Patient is discharged home in stable condition on  06/11/24        Newborn Data: Birth date:06/11/2024 Birth time:10:57 AM Gender:Female Living status:Living Apgars:9 ,9  Weight:4280 g    Magnesium Sulfate received: {Mag received:30440022} BMZ received: No Rhophylac:N/A MMR:N/A T-DaP:Given prenatally Flu: Yes RSV Vaccine received: No Transfusion:{Transfusion received:30440034}  Immunizations received: Immunization History  Administered Date(s) Administered   PFIZER(Purple Top)SARS-COV-2 Vaccination 05/02/2020, 06/21/2020   Tdap 10/29/2019    Physical exam  Vitals:   06/11/24 0817  BP:  119/79  Pulse: 98  Resp: 18  Temp: 98.2 F (36.8 C)  TempSrc: Oral  SpO2: 99%  Weight: 111.9 kg  Height: 5' (1.524 m)   General: {Exam; general:21111117} Lochia: {Desc; appropriate/inappropriate:30686::appropriate} Uterine Fundus: {Desc; firm/soft:30687} Incision: {Exam; incision:21111123} DVT Evaluation: {Exam; icu:7888877} Labs: Lab Results  Component Value Date   WBC 8.0 06/08/2024   HGB 12.8 06/08/2024   HCT 40.4 06/08/2024   MCV 80.6 06/08/2024   PLT 194 06/08/2024      Latest Ref Rng & Units 06/11/2024    8:04 AM  CMP  Glucose 70 - 99 mg/dL 98   BUN 6 - 20 mg/dL 7   Creatinine 9.55 - 8.99 mg/dL 9.46   Sodium 864 - 854 mmol/L 134   Potassium 3.5 - 5.1 mmol/L 4.1   Chloride 98 - 111 mmol/L 104   CO2 22 - 32 mmol/L 21   Calcium 8.9 - 10.3 mg/dL 9.0    Edinburgh Score:     No data to display         No data recorded  After visit meds:  Allergies as of 06/11/2024   No Known Allergies   Med Rec must be completed prior to using this [facility]***        Discharge home in stable condition Infant Feeding: {Baby feeding:23562} Infant Disposition:{CHL IP OB HOME WITH FNUYZM:76418} Discharge instruction: per After Visit Summary and Postpartum booklet. Activity: Advance as tolerated. Pelvic rest for 6 weeks.  Diet: {OB ipzu:78888878} Future Appointments:No future appointments. Follow up Visit:   Please schedule this patient for a In person postpartum visit in 6 weeks with the following provider: Any provider. Additional Postpartum F/U:Incision  check 1 week  Low risk pregnancy complicated by: N/A Delivery mode:  C-Section, Low Transverse Anticipated Birth Control:  BTL done PP   06/11/2024 Colter LITTIE Angles, MD

## 2024-06-11 NOTE — Telephone Encounter (Signed)
 Called patient today to make a payment over the phone for her tubal surgery. Patient did not answer the call and does not have her voice message set up. We will try to call later today once again.

## 2024-06-11 NOTE — Op Note (Addendum)
 Natalie Armstrong PROCEDURE DATE: 06/11/2024  PREOPERATIVE DIAGNOSIS: Intrauterine pregnancy at  [redacted]w[redacted]d weeks gestation; Elective Repeat  POSTOPERATIVE DIAGNOSIS: The same  PROCEDURE:  Low Transverse Cesarean Section with bilateral salpingectomy  SURGEON:  Dr. Lang Peel  ASSISTANT: Dr. Barkley Angles  INDICATIONS: Natalie Armstrong is a 33 y.o. H6E7997 at [redacted]w[redacted]d scheduled for cesarean section secondary to elective repeat.  The risks of cesarean section discussed with the patient included but were not limited to: bleeding which may require transfusion or reoperation; infection which may require antibiotics; injury to bowel, bladder, ureters or other surrounding organs; injury to the fetus; need for additional procedures including hysterectomy in the event of a life-threatening hemorrhage; placental abnormalities wth subsequent pregnancies, incisional problems, thromboembolic phenomenon and other postoperative/anesthesia complications. The patient concurred with the proposed plan, giving informed written consent for the procedure.    FINDINGS:  Viable female infant in cephalic presentation.  Apgars 9 and 9, weight, 9 pounds and 7 ounces.  Clear amniotic fluid.  Intact placenta, three vessel cord.  Normal uterus, fallopian tubes and ovaries bilaterally.  ANESTHESIA:    Spinal INTRAVENOUS FLUIDS:1941.25 ml ESTIMATED BLOOD LOSS: 546 ml URINE OUTPUT:  100 ml SPECIMENS: Placenta sent to L&D COMPLICATIONS: None immediate  PROCEDURE IN DETAIL:  The patient received intravenous antibiotics and had sequential compression devices applied to her lower extremities while in the preoperative area.  She was then taken to the operating room where spinal anesthesia was administered (epidural anesthesia was dosed up to surgical level) and was found to be adequate. She was then placed in a dorsal supine position with a leftward tilt, and prepped and draped in a sterile manner.  A foley  catheter was placed into her bladder and attached to constant gravity, which drained clear fluid throughout.  After an adequate timeout was performed, a Pfannenstiel skin incision was made with scalpel and carried through to the underlying layer of fascia. The fascia was incised in the midline and this incision was extended bilaterally using the Mayo scissors. Kocher clamps were applied to the superior aspect of the fascial incision and the underlying rectus muscles were dissected off bluntly. A similar process was carried out on the inferior aspect of the facial incision. The rectus muscles were separated in the midline bluntly and the peritoneum was entered bluntly. An Alexis retractor was placed to aid in visualization of the uterus.  Attention was turned to the lower uterine segment where a transverse hysterotomy was made with a scalpel and extended bilaterally bluntly. The infant was successfully delivered, and cord was clamped and cut and infant was handed over to awaiting neonatology team. Uterine massage was then administered and the placenta delivered intact with three-vessel cord. The uterus was then cleared of clot and debris.  The hysterotomy was closed with 0 Vicryl in a running locked fashion, and an imbricating layer was also placed with a 0 Vicryl. Overall, excellent hemostasis was noted. The patient's left fallopian tube was then identified and grasped with a Babcock clamp. The tube was then followed out to the fimbria. The Babcock clamp was then used to grasp the tube approximately 4 cm from the cornual region. A 6 cm segment of the tube was then ligated with free tie of plain gut suture, transected and excised. Good hemostasis was noted and the tube was returned to the abdomen. The right fallopian tube was then identified to its fimbriated end, ligated, and a 6 cm segment excised in a similar fashion. Excellent hemostasis  was noted, and the tube returned to the abdomen. The abdomen and the pelvis  were cleared of all clot and debris and the Thersia was removed. Hemostasis was confirmed on all surfaces.  The peritoneum was reapproximated using 2-0 vicryl running stitches. The fascia was then closed using 0 Vicryl in a running fashion. The subcutaneous layer was reapproximated with plain gut and the skin was closed with 4-0 vicryl. The patient tolerated the procedure well. Sponge, lap, instrument and needle counts were correct x 2. She was taken to the recovery room in stable condition.    Natalie Armstrong L, MD 06/11/2024 12:00 PM

## 2024-06-11 NOTE — OR Nursing (Signed)
 Penne [id number] used as interpreter

## 2024-06-12 ENCOUNTER — Encounter (HOSPITAL_COMMUNITY): Payer: Self-pay | Admitting: Family Medicine

## 2024-06-12 LAB — CBC
HCT: 33.8 % — ABNORMAL LOW (ref 36.0–46.0)
Hemoglobin: 10.3 g/dL — ABNORMAL LOW (ref 12.0–15.0)
MCH: 25.9 pg — ABNORMAL LOW (ref 26.0–34.0)
MCHC: 30.5 g/dL (ref 30.0–36.0)
MCV: 84.9 fL (ref 80.0–100.0)
Platelets: 109 K/uL — ABNORMAL LOW (ref 150–400)
RBC: 3.98 MIL/uL (ref 3.87–5.11)
RDW: 15 % (ref 11.5–15.5)
WBC: 14.2 K/uL — ABNORMAL HIGH (ref 4.0–10.5)
nRBC: 0 % (ref 0.0–0.2)

## 2024-06-12 NOTE — Progress Notes (Signed)
 POSTPARTUM PROGRESS NOTE POD #1  Subjective:  Natalie Armstrong is a 33 y.o. G3P2002 POD #1 s/p rLTCS at [redacted]w[redacted]d. No acute events overnight. She reports she is doing well. She denies any problems with ambulating, voiding or po intake. Denies dizziness or lightheadedness. Denies nausea or vomiting. She has passed flatus. Pain is well controlled.  Lochia is appropriate. Patient states her pad had to be changed overnight, most recently at 3am, and has filled another pad since.  Objective: Blood pressure 100/72, pulse 82, temperature 98.4 F (36.9 C), temperature source Oral, resp. rate 18, height 5' (1.524 m), weight 111.9 kg, last menstrual period 09/19/2023, SpO2 97%.  Physical Exam:  General: alert, cooperative and no distress Heart: regular rate Resp: nonlabored Uterine Fundus: firm, below the umbilicus, appropriately tender Extremities: no edema bilaterally Skin: warm, dry; incision clean/dry/intact w/ honeycomb dressing in place  Recent Labs    06/12/24 0523  HGB 10.3*  HCT 33.8*    Assessment/Plan: Natalie Armstrong is a 33 y.o. H6E7997 POD#1 s/p rLTCS at [redacted]w[redacted]d.  POD#1  - Doing well; pain well controlled. - Routine postpartum care - Lovenox for VTE prophylaxis -RPR (+), TPAL (-), repeat RPR in 6 to 8 weeks.   Feeding: breastfeeding Contraception: BTL completed pp  Dispo: Plan for discharge POD#2.  LOS: 1 day   Burnard Rummer, PA-S 06/12/2024, 7:56AM  Attestation of Supervision of Student:  I confirm that I have verified the information documented in the PA student's note and that I have also personally reperformed the history, physical exam and all medical decision making activities.  I have verified that all services and findings are accurately documented in this student's note; and I agree with management and plan as outlined in the documentation. I have also made any necessary editorial changes.   Barkley LITTIE Angles, MD OB Fellow 06/12/2024  8:24 AM

## 2024-06-13 ENCOUNTER — Other Ambulatory Visit (HOSPITAL_COMMUNITY): Payer: Self-pay

## 2024-06-13 DIAGNOSIS — Z9851 Tubal ligation status: Secondary | ICD-10-CM

## 2024-06-13 DIAGNOSIS — R7689 Other specified abnormal immunological findings in serum: Secondary | ICD-10-CM | POA: Insufficient documentation

## 2024-06-13 LAB — BIRTH TISSUE RECOVERY COLLECTION (PLACENTA DONATION)

## 2024-06-13 MED ORDER — SENNOSIDES-DOCUSATE SODIUM 8.6-50 MG PO TABS
2.0000 | ORAL_TABLET | Freq: Every day | ORAL | 0 refills | Status: AC
  Filled 2024-06-13: qty 60, 30d supply, fill #0

## 2024-06-13 MED ORDER — OXYCODONE HCL 5 MG PO TABS
5.0000 mg | ORAL_TABLET | ORAL | 0 refills | Status: AC | PRN
  Filled 2024-06-13: qty 20, 4d supply, fill #0

## 2024-06-13 MED ORDER — ACETAMINOPHEN 500 MG PO TABS: 1000.0000 mg | ORAL_TABLET | Freq: Four times a day (QID) | ORAL | Status: AC

## 2024-06-13 MED ORDER — IBUPROFEN 600 MG PO TABS
600.0000 mg | ORAL_TABLET | Freq: Four times a day (QID) | ORAL | 0 refills | Status: AC
  Filled 2024-06-13: qty 30, 8d supply, fill #0

## 2024-06-13 NOTE — Patient Instructions (Signed)
 If interested in an outpatient lactation consult in office or virtually please reach out to us  at [facility] (First Floor) [address] [place] Please feel free to out with any lactation related questions or concerns [phone]  to leave a message for our lactation voicemail box.  Lactation support groups:  [facility], Tuesdays 10:00 am -12:00 pm at [address] on the second floor in the conference room, lactating parents and lap babies welcome.  [email or web address]  [email or web address]  Si est interesado en una consulta ambulatoria sobre lactancia en el consultorio o virtualmente, comunquese con nosotros al [facility]) [address] [place], [place] No dude en comunicarse con cualquier pregunta o inquietud relacionada con la lactancia al [phone] para dejar un mensaje en nuestro buzn de voz de lactancia  Grupos de apoyo para la lactancia:  [facility], martes de 10:00 a. m. a 12:00 p. m., en [address], segundo piso, sala de conferencias. Se admiten madres lactantes y bebs en regazo.      Natalie Armstrong, [facility]

## 2024-06-13 NOTE — Plan of Care (Signed)
  Problem: Education: Goal: Knowledge of General Education information will improve Description: Including pain rating scale, medication(s)/side effects and non-pharmacologic comfort measures Outcome: Completed/Met   Problem: Health Behavior/Discharge Planning: Goal: Ability to manage health-related needs will improve Outcome: Completed/Met   Problem: Clinical Measurements: Goal: Ability to maintain clinical measurements within normal limits will improve Outcome: Completed/Met Goal: Will remain free from infection Outcome: Completed/Met Goal: Diagnostic test results will improve Outcome: Completed/Met Goal: Respiratory complications will improve Outcome: Completed/Met Goal: Cardiovascular complication will be avoided Outcome: Completed/Met   Problem: Activity: Goal: Risk for activity intolerance will decrease Outcome: Completed/Met   Problem: Nutrition: Goal: Adequate nutrition will be maintained Outcome: Completed/Met   Problem: Coping: Goal: Level of anxiety will decrease Outcome: Completed/Met   Problem: Elimination: Goal: Will not experience complications related to bowel motility Outcome: Completed/Met Goal: Will not experience complications related to urinary retention Outcome: Completed/Met   Problem: Pain Managment: Goal: General experience of comfort will improve and/or be controlled Outcome: Completed/Met   Problem: Safety: Goal: Ability to remain free from injury will improve Outcome: Completed/Met   Problem: Skin Integrity: Goal: Risk for impaired skin integrity will decrease Outcome: Completed/Met   Problem: Education: Goal: Knowledge of condition will improve Outcome: Completed/Met Goal: Individualized Educational Video(s) Outcome: Completed/Met Goal: Individualized Newborn Educational Video(s) Outcome: Completed/Met   Problem: Activity: Goal: Will verbalize the importance of balancing activity with adequate rest periods Outcome:  Completed/Met Goal: Ability to tolerate increased activity will improve Outcome: Completed/Met   Problem: Coping: Goal: Ability to identify and utilize available resources and services will improve Outcome: Completed/Met   Problem: Life Cycle: Goal: Chance of risk for complications during the postpartum period will decrease Outcome: Completed/Met   Problem: Role Relationship: Goal: Ability to demonstrate positive interaction with newborn will improve Outcome: Completed/Met   Problem: Skin Integrity: Goal: Demonstration of wound healing without infection will improve Outcome: Completed/Met   Problem: Education: Goal: Knowledge of the prescribed therapeutic regimen will improve Outcome: Completed/Met Goal: Understanding of sexual limitations or changes related to disease process or condition will improve Outcome: Completed/Met Goal: Individualized Educational Video(s) Outcome: Completed/Met   Problem: Self-Concept: Goal: Communication of feelings regarding changes in body function or appearance will improve Outcome: Completed/Met   Problem: Skin Integrity: Goal: Demonstration of wound healing without infection will improve Outcome: Completed/Met

## 2024-06-13 NOTE — Lactation Note (Signed)
 This note was copied from a baby's chart. Lactation Consultation Note  Patient Name: Natalie Armstrong Unijb'd Date: 06/13/2024 Age:35 hours Reason for consult: Initial assessment;Term;Other (Comment) (AMA, LGA)  Visited with family of 44 1/7 weeks female [name]; [name] is a P3 and experienced breastfeeding. She voiced she does some formula until her milk comes in but once it does she exclusively breastfeeds. She doesn't have a pump at home, provided a hand pump. Couplet is getting discharged today. Reviewed discharge education, feeding cues, size of baby's stomach, pumping schedule and anticipatory guidelines.   She politely declined a referral to [facility] OP but has their contact info in case she needs to reach out, she'll be F/U with [facility] for lactation care. Encouraged +8 feedings/24 hours or sooner if feeding cues are present and to pump whenever baby is getting a bottle. Female visitor present, she is [redacted] weeks pregnant and very supportive. All questions and concerns answered, family to contact [facility] services PRN.  Maternal Data Has patient been taught Hand Expression?: Yes Does the patient have breastfeeding experience prior to this delivery?: Yes How long did the patient breastfeed?: 1st one for 2 years and 2nd one for 1 1/2 years  Feeding Mother's Current Feeding Choice: Breast Milk and Formula  Lactation Tools Discussed/Used Tools: Flanges;Pump Flange Size: 18 Breast pump type: Manual Pump Education: Setup, frequency, and cleaning;Milk Storage Reason for Pumping: patient's request Pumping frequency: PRN or whenever baby is getting a bottle  Interventions Interventions: Breast feeding basics reviewed;Hand pump;Education;LC Services brochure  Discharge Discharge Education: Engorgement and breast care;Warning signs for feeding baby;Outpatient recommendation Pump: Manual  Consult Status Consult Status: Complete Date: 06/13/24 Follow-up type: Call as  needed   Ellar Hakala GORMAN Crate 06/13/2024, 10:49 AM

## 2024-06-19 ENCOUNTER — Ambulatory Visit (INDEPENDENT_AMBULATORY_CARE_PROVIDER_SITE_OTHER): Payer: Self-pay

## 2024-06-19 ENCOUNTER — Ambulatory Visit: Payer: Self-pay

## 2024-06-19 VITALS — BP 116/72 | HR 84 | Wt 244.6 lb

## 2024-06-19 DIAGNOSIS — Z98891 History of uterine scar from previous surgery: Secondary | ICD-10-CM

## 2024-06-19 NOTE — Progress Notes (Signed)
 Natalie Armstrong is here for C/S incision check. She delivered 06/11/24. Spanish interpreter used during visit. Patient states she has no pain at the incision. The incision appears well approximated, no redness or swelling. Reviewed daily wound care with patient and she voiced understanding. Reviewed signs and symptoms of infection and she verbalized understanding. I advised the patient to call the office if she has any questions or concerns.  Patient states she will be scheduling her postpartum visit at [facility]. Patient states she has no further questions or concerns.   Devon, RN 06/19/24
